# Patient Record
Sex: Female | Born: 1990 | Hispanic: Yes | Marital: Married | Smoking: Never smoker
Health system: Southern US, Community
[De-identification: ages and names within clinical notes are randomized; demographics above are authoritative.]

## PROBLEM LIST (undated history)

## (undated) ENCOUNTER — Inpatient Hospital Stay (HOSPITAL_COMMUNITY): Payer: Self-pay

## (undated) DIAGNOSIS — K429 Umbilical hernia without obstruction or gangrene: Secondary | ICD-10-CM

## (undated) DIAGNOSIS — D219 Benign neoplasm of connective and other soft tissue, unspecified: Secondary | ICD-10-CM

## (undated) DIAGNOSIS — O26613 Liver and biliary tract disorders in pregnancy, third trimester: Secondary | ICD-10-CM

## (undated) DIAGNOSIS — K831 Obstruction of bile duct: Secondary | ICD-10-CM

## (undated) DIAGNOSIS — Q513 Bicornate uterus: Secondary | ICD-10-CM

## (undated) DIAGNOSIS — O26643 Intrahepatic cholestasis of pregnancy, third trimester: Secondary | ICD-10-CM

## (undated) HISTORY — DX: Intrahepatic cholestasis of pregnancy, third trimester: O26.643

## (undated) HISTORY — DX: Obstruction of bile duct: K83.1

## (undated) HISTORY — DX: Liver and biliary tract disorders in pregnancy, third trimester: O26.613

---

## 2016-09-24 ENCOUNTER — Ambulatory Visit (INDEPENDENT_AMBULATORY_CARE_PROVIDER_SITE_OTHER): Payer: Self-pay

## 2016-09-24 ENCOUNTER — Ambulatory Visit (INDEPENDENT_AMBULATORY_CARE_PROVIDER_SITE_OTHER): Payer: Self-pay | Admitting: Physician Assistant

## 2016-09-24 VITALS — BP 128/82 | HR 80 | Temp 98.5°F | Resp 17 | Ht 63.5 in | Wt 124.0 lb

## 2016-09-24 DIAGNOSIS — T148XXA Other injury of unspecified body region, initial encounter: Secondary | ICD-10-CM

## 2016-09-24 DIAGNOSIS — M25552 Pain in left hip: Secondary | ICD-10-CM

## 2016-09-24 MED ORDER — MELOXICAM 7.5 MG PO TABS: 7.5000 mg | ORAL_TABLET | Freq: Every day | ORAL | 1 refills | Status: DC

## 2016-09-24 NOTE — Patient Instructions (Addendum)
Stretch your muscle, use ice and/or heat as necessary - 15 minutes at a time.  Mobic is an anti-inflammatory, you may take up to 15 mg a day -- Two in the morning or two at night, one in the morning and one at night -- do whatever feels best to you.   Thank you for coming in today. I hope you feel we met your needs.  Feel free to call [facility] if you have any questions or further requests.  Please consider signing up for MyChart if you do not already have it, as this is a great way to communicate with me.  Best,  Whitney McVey, PA-C   IF you received an x-ray today, you will receive an invoice from [facility]. Please contact [facility] at [phone] with questions or concerns regarding your invoice.   IF you received labwork today, you will receive an invoice from [employer]. Please contact [facility] at [phone] with questions or concerns regarding your invoice.   Our billing staff will not be able to assist you with questions regarding bills from these companies.  You will be contacted with the lab results as soon as they are available. The fastest way to get your results is to activate your My Chart account. Instructions are located on the last page of this paperwork. If you have not heard from [place] regarding the results in 2 weeks, please contact this office.
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
## 2016-11-08 ENCOUNTER — Emergency Department (HOSPITAL_COMMUNITY)
Admission: EM | Admit: 2016-11-08 | Discharge: 2016-11-09 | Disposition: A | Discharge status: Home / Self Care | Payer: Self-pay | Attending: Physician Assistant | Admitting: Physician Assistant

## 2016-11-08 ENCOUNTER — Emergency Department (HOSPITAL_COMMUNITY): Payer: Self-pay

## 2016-11-08 ENCOUNTER — Encounter (HOSPITAL_COMMUNITY): Payer: Self-pay

## 2016-11-08 DIAGNOSIS — Z79899 Other long term (current) drug therapy: Secondary | ICD-10-CM | POA: Insufficient documentation

## 2016-11-08 DIAGNOSIS — O0281 Inappropriate change in quantitative human chorionic gonadotropin (hCG) in early pregnancy: Secondary | ICD-10-CM | POA: Insufficient documentation

## 2016-11-08 DIAGNOSIS — N3000 Acute cystitis without hematuria: Secondary | ICD-10-CM

## 2016-11-08 DIAGNOSIS — N939 Abnormal uterine and vaginal bleeding, unspecified: Secondary | ICD-10-CM

## 2016-11-08 DIAGNOSIS — O039 Complete or unspecified spontaneous abortion without complication: Secondary | ICD-10-CM

## 2016-11-08 LAB — COMPREHENSIVE METABOLIC PANEL
ALBUMIN: 4.3 g/dL (ref 3.5–5.0)
ALT: 14 U/L (ref 14–54)
ANION GAP: 9 (ref 5–15)
AST: 18 U/L (ref 15–41)
Alkaline Phosphatase: 36 U/L — ABNORMAL LOW (ref 38–126)
BUN: 7 mg/dL (ref 6–20)
CHLORIDE: 105 mmol/L (ref 101–111)
CO2: 24 mmol/L (ref 22–32)
Calcium: 9.2 mg/dL (ref 8.9–10.3)
Creatinine, Ser: 0.78 mg/dL (ref 0.44–1.00)
GFR calc Af Amer: >60 mL/min (ref >60)
GFR calc non Af Amer: >60 mL/min (ref >60)
GLUCOSE: 97 mg/dL (ref 65–99)
POTASSIUM: 3.5 mmol/L (ref 3.5–5.1)
Sodium: 138 mmol/L (ref 135–145)
Total Bilirubin: 0.7 mg/dL (ref 0.3–1.2)
Total Protein: 7.2 g/dL (ref 6.5–8.1)

## 2016-11-08 LAB — CBC
HEMATOCRIT: 38.9 % (ref 36.0–46.0)
HEMOGLOBIN: 13 g/dL (ref 12.0–15.0)
MCH: 27.5 pg (ref 26.0–34.0)
MCHC: 33.4 g/dL (ref 30.0–36.0)
MCV: 82.2 fL (ref 78.0–100.0)
Platelets: 182 10*3/uL (ref 150–400)
RBC: 4.73 MIL/uL (ref 3.87–5.11)
RDW: 13.5 % (ref 11.5–15.5)
WBC: 9.3 10*3/uL (ref 4.0–10.5)

## 2016-11-08 LAB — LIPASE, BLOOD: LIPASE: 29 U/L (ref 11–51)

## 2016-11-08 LAB — URINALYSIS, ROUTINE W REFLEX MICROSCOPIC
Bilirubin Urine: NEGATIVE
GLUCOSE, UA: 250 mg/dL — AB
Hgb urine dipstick: NEGATIVE
NITRITE: POSITIVE — AB
PH: 6.5 (ref 5.0–8.0)
Protein, ur: >300 mg/dL — AB
SPECIFIC GRAVITY, URINE: 1.01 (ref 1.005–1.030)

## 2016-11-08 LAB — URINALYSIS, MICROSCOPIC (REFLEX)

## 2016-11-08 LAB — HCG, QUANTITATIVE, PREGNANCY: hCG, Beta Chain, Quant, S: 27838 m[IU]/mL — ABNORMAL HIGH (ref <5)

## 2016-11-08 MED ORDER — DEXTROSE 5 % IV SOLN
1.0000 g | Freq: Once | INTRAVENOUS | Status: AC
  Administered 2016-11-08: 1 g via INTRAVENOUS
  Filled 2016-11-08: qty 10

## 2016-11-08 MED ORDER — SODIUM CHLORIDE 0.9 % IV BOLUS (SEPSIS)
1000.0000 mL | Freq: Once | INTRAVENOUS | Status: AC
Start: 2016-11-08 — End: 2016-11-09
  Administered 2016-11-08: 1000 mL via INTRAVENOUS

## 2016-11-08 NOTE — ED Triage Notes (Signed)
Pt complaining of vaginal bleeding. Pt states LMP last month. Believes is pregnant. Unsure if miscarriage. Pt complaining of lower abdominal pain/cramping.

## 2016-11-08 NOTE — ED Provider Notes (Signed)
[facility] DEPT Provider Note   CSN: [id number] Arrival date & time: 11/08/16  2024     History   Chief Complaint Chief Complaint  Patient presents with  . Abdominal Pain  . Vaginal Bleeding    HPI Robyn Harvey is a 25 y.o. female.  Robyn Harvey is a 25 y.o. Female who presents to the emergency department complaining of vaginal bleeding since yesterday. She reports this started off light and became progressively worse today. She reports soiling 4 pads today. She reports bilateral lower abdominal cramping today. She does report some dysuria this morning. She denies other urinary symptoms. She denies previous pregnancy. She is sexually active. Last menstrual cycle was 09/05/2016. Patient denies fevers, chest pain, shortness of breath, lightheadedness, dizziness, syncope, urinary frequency, urinary urgency, back pain or rashes.   The history is provided by the spouse and the patient. The history is limited by a language barrier. A language interpreter was used.  Abdominal Pain   Associated symptoms include dysuria. Pertinent negatives include fever, diarrhea, nausea, vomiting, frequency, hematuria and headaches.  Vaginal Bleeding  Primary symptoms include pelvic pain, dysuria, and vaginal bleeding. Associated symptoms include abdominal pain. Pertinent negatives include no diarrhea, no nausea, no vomiting, no frequency, no light-headedness and no dizziness.    No past medical history on file.  There are no active problems to display for this patient.   No past surgical history on file.  OB History    No data available       Home Medications    Prior to Admission medications   Medication Sig Start Date End Date Taking? Authorizing Provider  acetaminophen (TYLENOL) 500 MG tablet Take 1 tablet (500 mg total) by mouth every 6 (six) hours as needed. 11/09/16   Waynetta Pean, PA-C  cephALEXin (KEFLEX) 500 MG capsule Take 1 capsule (500 mg total) by mouth 3 (three)  times daily. 11/09/16   Waynetta Pean, PA-C    Family History No family history on file.  Social History Social History  Substance Use Topics  . Smoking status: Never Smoker  . Smokeless tobacco: Never Used  . Alcohol use No     Allergies   Patient has no allergy information on record.   Review of Systems Review of Systems  Constitutional: Negative for chills and fever.  HENT: Negative for congestion and sore throat.   Eyes: Negative for visual disturbance.  Respiratory: Negative for cough and shortness of breath.   Cardiovascular: Negative for chest pain.  Gastrointestinal: Positive for abdominal pain. Negative for diarrhea, nausea and vomiting.  Genitourinary: Positive for dysuria, pelvic pain and vaginal bleeding. Negative for difficulty urinating, flank pain, frequency and hematuria.  Musculoskeletal: Negative for back pain and neck pain.  Skin: Negative for rash.  Neurological: Negative for dizziness, syncope, light-headedness and headaches.     Physical Exam Updated Vital Signs BP 92/58   Pulse 95   Temp 98 F (36.7 C) (Oral)   Resp 18   LMP 10/09/2016   SpO2 98%   Physical Exam  Constitutional: She appears well-developed and well-nourished. No distress.  Nontoxic appearing.  HENT:  Head: Normocephalic and atraumatic.  Mouth/Throat: Oropharynx is clear and moist.  Eyes: Conjunctivae are normal. Pupils are equal, round, and reactive to light. Right eye exhibits no discharge. Left eye exhibits no discharge.  Neck: Neck supple.  Cardiovascular: Normal rate, regular rhythm, normal heart sounds and intact distal pulses.  Exam reveals no gallop and no friction rub.   No murmur heard. Pulmonary/Chest:  Effort normal and breath sounds normal. No respiratory distress. She has no wheezes. She has no rales.  Abdominal: Soft. Bowel sounds are normal. She exhibits no distension and no mass. There is tenderness. There is no guarding.  Abdomen is soft. Mild bilateral  lower abdominal tenderness to palpation. No peritoneal signs. No CVA or flank tenderness.  Genitourinary:  Genitourinary Comments: Pelvic exam performed by me with female RN chaperone. Small amount of vaginal bleeding noted. Cervix is closed. No cervical motion tenderness. No adnexal tenderness or fullness.  Musculoskeletal: She exhibits no edema.  Lymphadenopathy:    She has no cervical adenopathy.  Neurological: She is alert. Coordination normal.  Skin: Skin is warm and dry. Capillary refill takes less than 2 seconds. No rash noted. She is not diaphoretic. No erythema. No pallor.  Psychiatric: She has a normal mood and affect. Her behavior is normal.  Nursing note and vitals reviewed.    ED Treatments / Results  Labs (all labs ordered are listed, but only abnormal results are displayed) Labs Reviewed  WET PREP, GENITAL - Abnormal; Notable for the following:       Result Value   WBC, Wet Prep HPF POC FEW (*)    All other components within normal limits  COMPREHENSIVE METABOLIC PANEL - Abnormal; Notable for the following:    Alkaline Phosphatase 36 (*)    All other components within normal limits  URINALYSIS, ROUTINE W REFLEX MICROSCOPIC - Abnormal; Notable for the following:    Color, Urine RED (*)    APPearance TURBID (*)    Glucose, UA 250 (*)    Ketones, ur >=80 (*)    Protein, ur >300 (*)    Nitrite POSITIVE (*)    Leukocytes, UA MODERATE (*)    All other components within normal limits  HCG, QUANTITATIVE, PREGNANCY - Abnormal; Notable for the following:    hCG, Beta Chain, [place] Brown, S 27,838 (*)    All other components within normal limits  URINALYSIS, MICROSCOPIC (REFLEX) - Abnormal; Notable for the following:    Bacteria, UA RARE (*)    Squamous Epithelial / LPF 0-5 (*)    All other components within normal limits  URINE CULTURE  LIPASE, BLOOD  CBC  GC/CHLAMYDIA PROBE AMP ([place]) NOT AT [facility]    EKG  EKG Interpretation None       Radiology US Ob Comp <  14 Wks  Result Date: 11/09/2016 CLINICAL DATA:  Acute onset of vaginal bleeding.  Initial encounter. EXAM: OBSTETRIC <14 WK [place] AND TRANSVAGINAL OB US TECHNIQUE: Both transabdominal and transvaginal ultrasound examinations were performed for complete evaluation of the gestation as well as the maternal uterus, adnexal regions, and pelvic cul-de-sac. Transvaginal technique was performed to assess early pregnancy. COMPARISON:  None. FINDINGS: Intrauterine gestational sac: None seen. Yolk sac:  N/A Embryo:  N/A Subchorionic hemorrhage:  None visualized. Maternal uterus/adnexae: An apparent partial septate uterus is noted. The ovaries are not visualized. No suspicious adnexal masses are seen; there is no secondary evidence for ovarian torsion. No free fluid is seen within the pelvic cul-de-sac. IMPRESSION: 1. No intrauterine gestational sac seen. No evidence for ectopic pregnancy at this time. Given the quantitative beta HCG level of 27,838, this may reflect recent spontaneous abortion. 2. Apparent partial septate uterus noted. Electronically Signed   By: Garald Balding M.D.   On: 11/09/2016 00:21   US Ob Transvaginal  Result Date: 11/09/2016 CLINICAL DATA:  Acute onset of vaginal bleeding.  Initial encounter. EXAM: OBSTETRIC <14  WK [place] AND TRANSVAGINAL OB US TECHNIQUE: Both transabdominal and transvaginal ultrasound examinations were performed for complete evaluation of the gestation as well as the maternal uterus, adnexal regions, and pelvic cul-de-sac. Transvaginal technique was performed to assess early pregnancy. COMPARISON:  None. FINDINGS: Intrauterine gestational sac: None seen. Yolk sac:  N/A Embryo:  N/A Subchorionic hemorrhage:  None visualized. Maternal uterus/adnexae: An apparent partial septate uterus is noted. The ovaries are not visualized. No suspicious adnexal masses are seen; there is no secondary evidence for ovarian torsion. No free fluid is seen within the pelvic cul-de-sac. IMPRESSION: 1. No  intrauterine gestational sac seen. No evidence for ectopic pregnancy at this time. Given the quantitative beta HCG level of 27,838, this may reflect recent spontaneous abortion. 2. Apparent partial septate uterus noted. Electronically Signed   By: Garald Balding M.D.   On: 11/09/2016 00:21    Procedures Procedures (including critical care time)  Medications Ordered in ED Medications  sodium chloride 0.9 % bolus 1,000 mL (0 mLs Intravenous Stopped 11/09/16 0049)  cefTRIAXone (ROCEPHIN) 1 g in dextrose 5 % 50 mL IVPB (0 g Intravenous Stopped 11/08/16 2323)     Initial Impression / Assessment and Plan / ED Course  I have reviewed the triage vital signs and the nursing notes.  Pertinent labs & imaging results that were available during my care of the patient were reviewed by me and considered in my medical decision making (see chart for details).  Clinical Course    This  is a 25 y.o. Female who presents to the emergency department complaining of vaginal bleeding since yesterday. She reports this started off light and became progressively worse today. She reports soiling 4 pads today. She reports bilateral lower abdominal cramping today. She does report some dysuria this morning. She denies other urinary symptoms. She denies previous pregnancy. She is sexually active. Last menstrual cycle was 09/05/2016. Patient denies fevers, chest pain, shortness of breath, lightheadedness, dizziness.  On examination patient is afebrile nontoxic appearing. Abdomen is soft and she has bilateral lower abdominal tenderness to palpation without peritoneal signs. On pelvic exam she has a slight amount of vaginal bleeding. Cervix is closed. No cervical motion tenderness. No adnexal tenderness or fullness. Urinalysis is nitrite positive with moderate leukocytes. Will provide with a gram of Rocephin for UTI. Quantitative pregnancy is [phone]. OB ultrasound was obtained which showed no intrauterine gestational sac. No  evidence for ectopic pregnancy. Likely spontaneous abortion. I advised the patient of these findings. We'll have her follow-up at the [facility] outpatient clinic in 48 hours. Keflex at home for UTI. I discussed return precautions. I advised the patient to follow-up with their primary care provider this week. I advised the patient to return to the emergency department with new or worsening symptoms or new concerns. The patient verbalized understanding and agreement with plan.      Final Clinical Impressions(s) / ED Diagnoses   Final diagnoses:  Vaginal bleeding  Spontaneous abortion  Acute cystitis without hematuria    New Prescriptions New Prescriptions   ACETAMINOPHEN (TYLENOL) 500 MG TABLET    Take 1 tablet (500 mg total) by mouth every 6 (six) hours as needed.   CEPHALEXIN (KEFLEX) 500 MG CAPSULE    Take 1 capsule (500 mg total) by mouth 3 (three) times daily.     Waynetta Pean, PA-C 11/09/16 0149    Courteney Julio Alm, MD 11/12/16 [id number]

## 2016-11-08 NOTE — ED Notes (Signed)
Patient transported to Ultrasound 

## 2016-11-09 LAB — GC/CHLAMYDIA PROBE AMP (~~LOC~~) NOT AT ARMC
CHLAMYDIA, DNA PROBE: NEGATIVE
Neisseria Gonorrhea: NEGATIVE

## 2016-11-09 LAB — WET PREP, GENITAL
Clue Cells Wet Prep HPF POC: NONE SEEN
Sperm: NONE SEEN
Trich, Wet Prep: NONE SEEN
YEAST WET PREP: NONE SEEN

## 2016-11-09 MED ORDER — CEPHALEXIN 500 MG PO CAPS: 500.0000 mg | ORAL_CAPSULE | Freq: Three times a day (TID) | ORAL | 0 refills | Status: DC

## 2016-11-09 MED ORDER — ACETAMINOPHEN 500 MG PO TABS: 500.0000 mg | ORAL_TABLET | Freq: Four times a day (QID) | ORAL | 0 refills | Status: DC | PRN

## 2016-11-09 NOTE — ED Notes (Signed)
Pt departed in NAD.  

## 2016-11-10 LAB — URINE CULTURE

## 2016-11-11 ENCOUNTER — Other Ambulatory Visit: Payer: Self-pay

## 2016-11-11 DIAGNOSIS — O039 Complete or unspecified spontaneous abortion without complication: Secondary | ICD-10-CM

## 2016-11-12 LAB — HCG, QUANTITATIVE, PREGNANCY: HCG, BETA CHAIN, QUANT, S: 2178.2 m[IU]/mL — AB

## 2017-01-10 ENCOUNTER — Ambulatory Visit (INDEPENDENT_AMBULATORY_CARE_PROVIDER_SITE_OTHER): Payer: Self-pay | Admitting: Family Medicine

## 2017-01-10 ENCOUNTER — Ambulatory Visit (INDEPENDENT_AMBULATORY_CARE_PROVIDER_SITE_OTHER): Payer: Self-pay

## 2017-01-10 ENCOUNTER — Encounter: Payer: Self-pay | Admitting: Family Medicine

## 2017-01-10 VITALS — BP 108/72 | HR 89 | Temp 98.3°F | Ht 63.5 in | Wt 127.0 lb

## 2017-01-10 DIAGNOSIS — Z23 Encounter for immunization: Secondary | ICD-10-CM

## 2017-01-10 DIAGNOSIS — R1084 Generalized abdominal pain: Secondary | ICD-10-CM

## 2017-01-10 DIAGNOSIS — K5900 Constipation, unspecified: Secondary | ICD-10-CM

## 2017-01-10 LAB — POCT CBC
Granulocyte percent: 60.3 %G (ref 37–80)
HCT, POC: 36.8 % — AB (ref 37.7–47.9)
Hemoglobin: 12.7 g/dL (ref 12.2–16.2)
Lymph, poc: 1.8 (ref 0.6–3.4)
MCH: 28 pg (ref 27–31.2)
MCHC: 34.4 g/dL (ref 31.8–35.4)
MCV: 81.4 fL (ref 80–97)
MID (CBC): 0.3 (ref 0–0.9)
MPV: 7.4 fL (ref 0–99.8)
POC GRANULOCYTE: 3.2 (ref 2–6.9)
POC LYMPH PERCENT: 34.5 %L (ref 10–50)
POC MID %: 5.2 % (ref 0–12)
Platelet Count, POC: 190 10*3/uL (ref 142–424)
RBC: 4.52 M/uL (ref 4.04–5.48)
RDW, POC: 14.3 %
WBC: 5.3 10*3/uL (ref 4.6–10.2)

## 2017-01-10 LAB — POCT URINALYSIS DIP (MANUAL ENTRY)
BILIRUBIN UA: NEGATIVE
Glucose, UA: NEGATIVE
Ketones, POC UA: NEGATIVE
LEUKOCYTES UA: NEGATIVE
Nitrite, UA: NEGATIVE
PH UA: 5.5
Spec Grav, UA: >=1.03
Urobilinogen, UA: 0.2

## 2017-01-10 LAB — POC MICROSCOPIC URINALYSIS (UMFC): Mucus: ABSENT

## 2017-01-10 LAB — POCT URINE PREGNANCY: PREG TEST UR: NEGATIVE

## 2017-01-10 MED ORDER — RANITIDINE HCL 150 MG PO TABS: 150.0000 mg | ORAL_TABLET | Freq: Every day | ORAL | 0 refills | Status: DC

## 2017-01-10 NOTE — Progress Notes (Signed)
Patient ID: Robyn Harvey, female    DOB: February 18, 1991, 27 y.o.   MRN: [id number]  PCP: No PCP Per Patient  Chief Complaint  Patient presents with  . Abdominal Pain    X 2 weeks    Subjective:  HPI [id number]   26 year old female presents for evaluation of abdominal pain  X 2 weeks. Complains of RUQ abdominal pain that starts on the left side and radiates to right side.  Started on left side and right and center of stomach. Not so painful today. Occurs intermittently over the last 2 weeks. At its worst sharp and interferes with sleep. No medication. Feels nausea. No nausea or diarrhea  Headache, chills, and hot flashes. Abdominal pain radiates to lower back occasionally. Complains some days she has increased urine frequency and urine is more darker than normal. Unknown if fever.  Social History   Social History  . Marital status: Married    Spouse name: N/A  . Number of children: N/A  . Years of education: N/A   Occupational History  . Not on file.   Social History Main Topics  . Smoking status: Never Smoker  . Smokeless tobacco: Never Used  . Alcohol use No  . Drug use: No  . Sexual activity: No   Other Topics Concern  . Not on file   Social History Narrative   ** Merged History Encounter **       Review of Systems  See HPI  Prior to Admission medications   Medication Sig Start Date End Date Taking? Authorizing Provider  acetaminophen (TYLENOL) 500 MG tablet Take 1 tablet (500 mg total) by mouth every 6 (six) hours as needed. 11/09/16  Yes Waynetta Pean, PA-C  cephALEXin (KEFLEX) 500 MG capsule Take 1 capsule (500 mg total) by mouth 3 (three) times daily. Patient not taking: Reported on 01/10/2017 11/09/16   Waynetta Pean, PA-C  meloxicam (MOBIC) 7.5 MG tablet Take 1 tablet (7.5 mg total) by mouth daily. May take up to 15mg  day Patient not taking: Reported on 01/10/2017 09/24/16   Gelene Mink McVey, PA-C    Past Medical, Surgical Family  and Social History reviewed and updated.    Objective:   Today's Vitals   01/10/17 1426  BP: 108/72  Pulse: 89  Temp: 98.3 F (36.8 C)  TempSrc: Oral  SpO2: 99%  Weight: 127 lb (57.6 kg)  Height: 5' 3.5" (1.613 m)    Wt Readings from Last 3 Encounters:  01/10/17 127 lb (57.6 kg)  09/24/16 124 lb (56.2 kg)   Physical Exam  Constitutional: She is oriented to person, place, and time. She appears well-developed and well-nourished.  HENT:  Head: Normocephalic and atraumatic.  Eyes: Pupils are equal, round, and reactive to light.  Neck: Normal range of motion. Neck supple.  Cardiovascular: Normal rate, regular rhythm, normal heart sounds and intact distal pulses.   Pulmonary/Chest: Effort normal and breath sounds normal.  Abdominal: Soft. Bowel sounds are normal. There is generalized tenderness. There is no CVA tenderness.  Musculoskeletal: Normal range of motion.  Lymphadenopathy:    She has no cervical adenopathy.  Neurological: She is alert and oriented to person, place, and time.  Skin: Skin is warm and dry.  Psychiatric: She has a normal mood and affect. Her behavior is normal. Judgment and thought content normal.   Assessment & Plan:  1. Generalized abdominal pain 2. Need for prophylactic vaccination and inoculation against influenza 3. Constipation, unspecified constipation type  Robyn Harvey 25  year old female presents today, absent of acute distress, overall well appearing, and denies currently experiencing any abdominal pain. Reports intermittent abdominal pain that starts sometimes in RUQ and radiates to LLQ and bilateral back. On exam today she localized the pain lateral left of the umbilicus. Negative of mass or rebound tenderness with palpation. KUB revealed significant stool burden. UA showed trace of protein with moderate hematuria. I attribute hematuria to patient is due to have her menses. Urine pregnancy test was negative.  Plan: Treating for constipation    -Purchase over the counter Miralax to relieve bowel burden 17 grams per day in 8 oz of water drink until you experience at least 5 bulky stools.  For acute abdominal discomfort and bloating take Ranitidine 150 mg once daily as needed.     Return for follow-up as needed or if conditions worsens.  Carroll Sage. Kenton Kingfisher, MSN, FNP-C [facility]

## 2017-01-10 NOTE — Patient Instructions (Addendum)
Constipation  Purchase over the counter Miralax to relieve bowel burden 17 grams per day in 8 oz of water.  Ranitidine 150 mg once daily for abdominal discomfort.     IF you received an x-ray today, you will receive an invoice from [facility]. Please contact [facility] at [phone] with questions or concerns regarding your invoice.   IF you received labwork today, you will receive an invoice from [facility]. Please contact [facility] at [phone] with questions or concerns regarding your invoice.   Our billing staff will not be able to assist you with questions regarding bills from these companies.  You will be contacted with the lab results as soon as they are available. The fastest way to get your results is to activate your My Chart account. Instructions are located on the last page of this paperwork. If you have not heard from [place] regarding the results in 2 weeks, please contact this office.     High-Fiber Diet Fiber, also called dietary fiber, is a type of carbohydrate found in fruits, vegetables, whole grains, and beans. A high-fiber diet can have many health benefits. Your health care provider may recommend a high-fiber diet to help:  Prevent constipation. Fiber can make your bowel movements more regular.  Lower your cholesterol.  Relieve hemorrhoids, uncomplicated diverticulosis, or irritable bowel syndrome.  Prevent overeating as part of a weight-loss plan.  Prevent heart disease, type 2 diabetes, and certain cancers. What is my plan? The recommended daily intake of fiber includes:  38 grams for men under age 65.  70 grams for men over age 86.  73 grams for women under age 52.  66 grams for women over age 78. You can get the recommended daily intake of dietary fiber by eating a variety of fruits, vegetables, grains, and beans. Your health care provider may also recommend a fiber supplement if it is not possible to get enough fiber through your  diet. What do I need to know about a high-fiber diet?  Fiber supplements have not been widely studied for their effectiveness, so it is better to get fiber through food sources.  Always check the fiber content on thenutrition facts label of any prepackaged food. Look for foods that contain at least 5 grams of fiber per serving.  Ask your dietitian if you have questions about specific foods that are related to your condition, especially if those foods are not listed in the following section.  Increase your daily fiber consumption gradually. Increasing your intake of dietary fiber too quickly may cause bloating, cramping, or gas.  Drink plenty of water. Water helps you to digest fiber. What foods can I eat? Grains  Whole-grain breads. Multigrain cereal. Oats and oatmeal. Brown rice. Barley. Bulgur wheat. [place]. Bran muffins. Popcorn. Rye wafer crackers. Vegetables  Sweet potatoes. Spinach. Kale. Artichokes. Cabbage. Broccoli. Green peas. Carrots. Squash. Fruits  Berries. Pears. Apples. Oranges. Avocados. Prunes and raisins. Dried figs. Meats and Other Protein Sources  [place], kidney, pinto, and soy beans. Split peas. Lentils. Nuts and seeds. Dairy  Fiber-fortified yogurt. Beverages  Fiber-fortified soy milk. Fiber-fortified orange juice. Other  Fiber bars. The items listed above may not be a complete list of recommended foods or beverages. Contact your dietitian for more options.  What foods are not recommended? Grains  White bread. Pasta made with refined flour. White rice. Vegetables  Fried potatoes. Canned vegetables. Well-cooked vegetables. Fruits  Fruit juice. Cooked, strained fruit. Meats and Other Protein Sources  Fatty cuts of meat. [place]  poultry or fried fish. Dairy  Milk. Yogurt. Cream cheese. Sour cream. Beverages  Soft drinks. Other  Cakes and pastries. Butter and oils. The items listed above may not be a complete list of foods and beverages to avoid. Contact your  dietitian for more information.  What are some tips for including high-fiber foods in my diet?  Eat a wide variety of high-fiber foods.  Make sure that half of all grains consumed each day are whole grains.  Replace breads and cereals made from refined flour or white flour with whole-grain breads and cereals.  Replace white rice with brown rice, bulgur wheat, or millet.  Start the day with a breakfast that is high in fiber, such as a cereal that contains at least 5 grams of fiber per serving.  Use beans in place of meat in soups, salads, or pasta.  Eat high-fiber snacks, such as berries, raw vegetables, nuts, or popcorn. This information is not intended to replace advice given to you by your health care provider. Make sure you discuss any questions you have with your health care provider. Document Released: 11/01/2005 Document Revised: 04/08/2016 Document Reviewed: 04/16/2014 Elsevier Interactive Patient Education  2017 [employer].

## 2017-03-03 ENCOUNTER — Encounter (HOSPITAL_COMMUNITY): Payer: Self-pay

## 2017-03-03 DIAGNOSIS — R102 Pelvic and perineal pain: Secondary | ICD-10-CM | POA: Insufficient documentation

## 2017-03-03 DIAGNOSIS — Z79899 Other long term (current) drug therapy: Secondary | ICD-10-CM | POA: Insufficient documentation

## 2017-03-03 LAB — URINALYSIS, ROUTINE W REFLEX MICROSCOPIC
BILIRUBIN URINE: NEGATIVE
Bacteria, UA: NONE SEEN
Glucose, UA: NEGATIVE mg/dL
KETONES UR: NEGATIVE mg/dL
Leukocytes, UA: NEGATIVE
Nitrite: NEGATIVE
PH: 8 (ref 5.0–8.0)
Protein, ur: NEGATIVE mg/dL
SPECIFIC GRAVITY, URINE: 1.017 (ref 1.005–1.030)
SQUAMOUS EPITHELIAL / LPF: NONE SEEN

## 2017-03-03 LAB — POC URINE PREG, ED: PREG TEST UR: NEGATIVE

## 2017-03-03 NOTE — ED Triage Notes (Signed)
Pt endorses right pelvic pain that began yesterday. Pt also states that she had a drop of blood in her urine. VSS. Denies n/v/d.

## 2017-03-04 ENCOUNTER — Emergency Department (HOSPITAL_COMMUNITY): Payer: Self-pay

## 2017-03-04 ENCOUNTER — Emergency Department (HOSPITAL_COMMUNITY)
Admission: EM | Admit: 2017-03-04 | Discharge: 2017-03-04 | Disposition: A | Discharge status: Home / Self Care | Payer: Self-pay | Attending: Emergency Medicine | Admitting: Emergency Medicine

## 2017-03-04 DIAGNOSIS — R102 Pelvic and perineal pain: Secondary | ICD-10-CM

## 2017-03-04 LAB — GC/CHLAMYDIA PROBE AMP (~~LOC~~) NOT AT ARMC
CHLAMYDIA, DNA PROBE: NEGATIVE
Neisseria Gonorrhea: NEGATIVE

## 2017-03-04 LAB — WET PREP, GENITAL
SPERM: NONE SEEN
Trich, Wet Prep: NONE SEEN
YEAST WET PREP: NONE SEEN

## 2017-03-04 MED ORDER — ACETAMINOPHEN 500 MG PO TABS
1000.0000 mg | ORAL_TABLET | Freq: Once | ORAL | Status: AC
  Administered 2017-03-04: 1000 mg via ORAL
  Filled 2017-03-04: qty 2

## 2017-03-04 NOTE — Discharge Instructions (Signed)
Your pelvic ultrasound did not show any concerning cause of your pain. We believe that your symptoms may be due to a muscle strain.Take tylenol for pain. Follow up with your primary care doctor to ensure that symptoms resolve.  Su ultrasonido plvico no mostr ninguna causa relacionada con su dolor. Creemos que sus sntomas pueden deberse a una distensin muscular. Tome Tylenol para Conservation officer, historic buildings. Haga un seguimiento con su mdico de atencin primaria para asegurarse de que los sntomas se resuelvan.

## 2017-03-04 NOTE — ED Provider Notes (Signed)
[facility] DEPT Provider Note   CSN: [id number] Arrival date & time: 03/03/17  2208    History   Chief Complaint Chief Complaint  Patient presents with  . Pelvic Pain    HPI Robyn Harvey is a 26 y.o. female.  26 year old female with a history of abortion presents to the emergency department for right-sided pelvic pain. She states the pain began yesterday. Pain has been fairly constant, but waxing and waning in severity. She notes that it radiates down her right thigh. It is aggravated with ambulation. Patient has not taken any medications for her symptoms. She denies fever, vomiting, diarrhea, dysuria. She did notice one drop of blood in her urine yesterday. Otherwise, the patient has had no complaints of hematuria. No history of abdominal surgeries.      History reviewed. No pertinent past medical history.  There are no active problems to display for this patient.   History reviewed. No pertinent surgical history.  OB History    No data available       Home Medications    Prior to Admission medications   Medication Sig Start Date End Date Taking? Authorizing Provider  acetaminophen (TYLENOL) 500 MG tablet Take 1 tablet (500 mg total) by mouth every 6 (six) hours as needed. Patient not taking: Reported on 03/04/2017 11/09/16   Waynetta Pean, PA-C    Family History History reviewed. No pertinent family history.  Social History Social History  Substance Use Topics  . Smoking status: Never Smoker  . Smokeless tobacco: Never Used  . Alcohol use No     Allergies   Patient has no known allergies.   Review of Systems Review of Systems Ten systems reviewed and are negative for acute change, except as noted in the HPI.    Physical Exam Updated Vital Signs BP 91/61   Pulse 82   Temp 98.3 F (36.8 C) (Oral)   Resp 18   Ht 5\' 2"  (1.575 m)   Wt 59 kg   LMP 02/08/2017 (Exact Date)   SpO2 98%   BMI 23.78 kg/m   Physical Exam    Constitutional: She is oriented to person, place, and time. She appears well-developed and well-nourished. No distress.  Nontoxic and in no acute distress  HENT:  Head: Normocephalic and atraumatic.  Eyes: Conjunctivae and EOM are normal. No scleral icterus.  Neck: Normal range of motion.  Pulmonary/Chest: Effort normal. No respiratory distress. She has no wheezes.  Respirations even and unlabored.  Abdominal: Soft. She exhibits no distension and no mass. There is no tenderness. There is no guarding.    Soft, nontender abdomen. No distention or peritoneal signs.  Genitourinary: Vagina normal. There is no rash, tenderness or lesion on the right labia. There is no rash, tenderness or lesion on the left labia. Uterus is not tender. Cervix exhibits no motion tenderness and no friability. Right adnexum displays tenderness. Right adnexum displays no mass. Left adnexum displays no mass and no tenderness.  Musculoskeletal: Normal range of motion.  TTP to right groin without crepitus or deformity. Normal ROM of BLE.  Neurological: She is alert and oriented to person, place, and time.  Skin: Skin is warm and dry. No rash noted. She is not diaphoretic. No erythema. No pallor.  Psychiatric: She has a normal mood and affect. Her behavior is normal.  Nursing note and vitals reviewed.    ED Treatments / Results  Labs (all labs ordered are listed, but only abnormal results are displayed) Labs Reviewed  WET PREP, GENITAL - Abnormal; Notable for the following:       Result Value   Clue Cells Wet Prep HPF POC PRESENT (*)    WBC, Wet Prep HPF POC MANY (*)    All other components within normal limits  URINALYSIS, ROUTINE W REFLEX MICROSCOPIC - Abnormal; Notable for the following:    Hgb urine dipstick SMALL (*)    All other components within normal limits  POC URINE PREG, ED  GC/CHLAMYDIA PROBE AMP ([place]) NOT AT [facility]    EKG  EKG Interpretation None       Radiology US Transvaginal  Non-ob  Result Date: 03/04/2017 CLINICAL DATA:  Initial evaluation for acute right lower quadrant pelvic pain for 1 day. EXAM: TRANSABDOMINAL AND TRANSVAGINAL ULTRASOUND OF PELVIS DOPPLER ULTRASOUND OF OVARIES TECHNIQUE: Both transabdominal and transvaginal ultrasound examinations of the pelvis were performed. Transabdominal technique was performed for global imaging of the pelvis including uterus, ovaries, adnexal regions, and pelvic cul-de-sac. It was necessary to proceed with endovaginal exam following the transabdominal exam to visualize the uterus and ovaries. Color and duplex Doppler ultrasound was utilized to evaluate blood flow to the ovaries. COMPARISON:  None. FINDINGS: Uterus Measurements: 6.1 x 3.7 x 6.7 cm. Septate versus arcuate uterus noted. Small fibroid measuring 1.0 x 0.6 x 0.7 cm noted within the uterine body. Endometrium Thickness: 14-15 mm.  No focal abnormality visualized. Right ovary Measurements: 2.6 x 1.3 x 1.5 cm. Normal appearance/no adnexal mass. Left ovary Measurements: 3.7 x 2.0 x 2.4 cm. Normal appearance/no adnexal mass. Pulsed Doppler evaluation of both ovaries demonstrates normal low-resistance arterial and venous waveforms. Other findings No abnormal free fluid. IMPRESSION: 1. Negative pelvic ultrasound. No acute abnormality identified. No evidence for ovarian torsion. 2. Small uterine fibroid as above. Electronically Signed   By: Jeannine Boga M.D.   On: 03/04/2017 04:54   US Pelvis Complete  Result Date: 03/04/2017 CLINICAL DATA:  Initial evaluation for acute right lower quadrant pelvic pain for 1 day. EXAM: TRANSABDOMINAL AND TRANSVAGINAL ULTRASOUND OF PELVIS DOPPLER ULTRASOUND OF OVARIES TECHNIQUE: Both transabdominal and transvaginal ultrasound examinations of the pelvis were performed. Transabdominal technique was performed for global imaging of the pelvis including uterus, ovaries, adnexal regions, and pelvic cul-de-sac. It was necessary to proceed with  endovaginal exam following the transabdominal exam to visualize the uterus and ovaries. Color and duplex Doppler ultrasound was utilized to evaluate blood flow to the ovaries. COMPARISON:  None. FINDINGS: Uterus Measurements: 6.1 x 3.7 x 6.7 cm. Septate versus arcuate uterus noted. Small fibroid measuring 1.0 x 0.6 x 0.7 cm noted within the uterine body. Endometrium Thickness: 14-15 mm.  No focal abnormality visualized. Right ovary Measurements: 2.6 x 1.3 x 1.5 cm. Normal appearance/no adnexal mass. Left ovary Measurements: 3.7 x 2.0 x 2.4 cm. Normal appearance/no adnexal mass. Pulsed Doppler evaluation of both ovaries demonstrates normal low-resistance arterial and venous waveforms. Other findings No abnormal free fluid. IMPRESSION: 1. Negative pelvic ultrasound. No acute abnormality identified. No evidence for ovarian torsion. 2. Small uterine fibroid as above. Electronically Signed   By: Jeannine Boga M.D.   On: 03/04/2017 04:54   [place] Art/ven Flow Abd Pelv Doppler  Result Date: 03/04/2017 CLINICAL DATA:  Initial evaluation for acute right lower quadrant pelvic pain for 1 day. EXAM: TRANSABDOMINAL AND TRANSVAGINAL ULTRASOUND OF PELVIS DOPPLER ULTRASOUND OF OVARIES TECHNIQUE: Both transabdominal and transvaginal ultrasound examinations of the pelvis were performed. Transabdominal technique was performed for global imaging of the pelvis including uterus, ovaries, adnexal  regions, and pelvic cul-de-sac. It was necessary to proceed with endovaginal exam following the transabdominal exam to visualize the uterus and ovaries. Color and duplex Doppler ultrasound was utilized to evaluate blood flow to the ovaries. COMPARISON:  None. FINDINGS: Uterus Measurements: 6.1 x 3.7 x 6.7 cm. Septate versus arcuate uterus noted. Small fibroid measuring 1.0 x 0.6 x 0.7 cm noted within the uterine body. Endometrium Thickness: 14-15 mm.  No focal abnormality visualized. Right ovary Measurements: 2.6 x 1.3 x 1.5 cm. Normal  appearance/no adnexal mass. Left ovary Measurements: 3.7 x 2.0 x 2.4 cm. Normal appearance/no adnexal mass. Pulsed Doppler evaluation of both ovaries demonstrates normal low-resistance arterial and venous waveforms. Other findings No abnormal free fluid. IMPRESSION: 1. Negative pelvic ultrasound. No acute abnormality identified. No evidence for ovarian torsion. 2. Small uterine fibroid as above. Electronically Signed   By: Jeannine Boga M.D.   On: 03/04/2017 04:54    Procedures Procedures (including critical care time)  Medications Ordered in ED Medications  acetaminophen (TYLENOL) tablet 1,000 mg (1,000 mg Oral Given 03/04/17 0329)     Initial Impression / Assessment and Plan / ED Course  I have reviewed the triage vital signs and the nursing notes.  Pertinent labs & imaging results that were available during my care of the patient were reviewed by me and considered in my medical decision making (see chart for details).     26 year old female presents to the emergency department for evaluation of pelvic pain 2 days. Pain was found to be more focal to the right inguinal region. No palpable hernias. Patient also noted to have mild right adnexal tenderness on exam.  Workup today consisted of urinalysis and urine pregnancy which were both reassuring. Wet prep findings nonspecific. Gonorrhea/chlamydia culture pending. A pelvic ultrasound was obtained which does not show any evidence of TOA, ovarian torsion, or ovarian cyst. The patient has continued to rest comfortably in the emergency department. She is afebrile with stable vital signs. Repeat abdominal exam is stable; no signs of acute surgical abdomen.  Given reassuring workup, I believe the patient is appropriate for continued management on an outpatient basis. I do not see indication for further emergent imaging. Supportive management discussed and provided. Patient discharged in stable condition with no unaddressed  concerns.   Final Clinical Impressions(s) / ED Diagnoses   Final diagnoses:  Pelvic pain in female    New Prescriptions Discharge Medication List as of 03/04/2017  5:10 AM       Antonietta Breach, PA-C 03/04/17 [place], MD 03/06/17 [phone]

## 2017-03-04 NOTE — ED Notes (Signed)
Spanish speaker only.

## 2017-03-10 ENCOUNTER — Other Ambulatory Visit: Payer: Self-pay | Admitting: Family Medicine

## 2017-09-23 ENCOUNTER — Inpatient Hospital Stay (HOSPITAL_COMMUNITY): Payer: Self-pay

## 2017-09-23 ENCOUNTER — Encounter (HOSPITAL_COMMUNITY): Payer: Self-pay | Admitting: *Deleted

## 2017-09-23 ENCOUNTER — Inpatient Hospital Stay (HOSPITAL_COMMUNITY)
Admission: AD | Admit: 2017-09-23 | Discharge: 2017-09-23 | Disposition: A | Discharge status: Home / Self Care | Payer: Self-pay | Source: Ambulatory Visit | Attending: Obstetrics & Gynecology | Admitting: Obstetrics & Gynecology

## 2017-09-23 DIAGNOSIS — O3411 Maternal care for benign tumor of corpus uteri, first trimester: Secondary | ICD-10-CM | POA: Insufficient documentation

## 2017-09-23 DIAGNOSIS — O3410 Maternal care for benign tumor of corpus uteri, unspecified trimester: Secondary | ICD-10-CM

## 2017-09-23 DIAGNOSIS — O219 Vomiting of pregnancy, unspecified: Secondary | ICD-10-CM

## 2017-09-23 DIAGNOSIS — Z3A01 Less than 8 weeks gestation of pregnancy: Secondary | ICD-10-CM | POA: Insufficient documentation

## 2017-09-23 DIAGNOSIS — Z3491 Encounter for supervision of normal pregnancy, unspecified, first trimester: Secondary | ICD-10-CM

## 2017-09-23 DIAGNOSIS — D259 Leiomyoma of uterus, unspecified: Secondary | ICD-10-CM | POA: Insufficient documentation

## 2017-09-23 DIAGNOSIS — O209 Hemorrhage in early pregnancy, unspecified: Secondary | ICD-10-CM | POA: Insufficient documentation

## 2017-09-23 LAB — URINALYSIS, ROUTINE W REFLEX MICROSCOPIC
BILIRUBIN URINE: NEGATIVE
Glucose, UA: NEGATIVE mg/dL
HGB URINE DIPSTICK: NEGATIVE
Ketones, ur: NEGATIVE mg/dL
Leukocytes, UA: NEGATIVE
Nitrite: NEGATIVE
PH: 8 (ref 5.0–8.0)
Protein, ur: 30 mg/dL — AB
SPECIFIC GRAVITY, URINE: 1.019 (ref 1.005–1.030)
WBC UA: NONE SEEN WBC/hpf (ref 0–5)

## 2017-09-23 LAB — CBC
HEMATOCRIT: 36.5 % (ref 36.0–46.0)
Hemoglobin: 12.3 g/dL (ref 12.0–15.0)
MCH: 27.3 pg (ref 26.0–34.0)
MCHC: 33.7 g/dL (ref 30.0–36.0)
MCV: 81.1 fL (ref 78.0–100.0)
Platelets: 168 10*3/uL (ref 150–400)
RBC: 4.5 MIL/uL (ref 3.87–5.11)
RDW: 14.7 % (ref 11.5–15.5)
WBC: 6.8 10*3/uL (ref 4.0–10.5)

## 2017-09-23 LAB — POCT PREGNANCY, URINE: Preg Test, Ur: POSITIVE — AB

## 2017-09-23 LAB — WET PREP, GENITAL
Clue Cells Wet Prep HPF POC: NONE SEEN
Sperm: NONE SEEN
Trich, Wet Prep: NONE SEEN
YEAST WET PREP: NONE SEEN

## 2017-09-23 LAB — HCG, QUANTITATIVE, PREGNANCY: hCG, Beta Chain, Quant, S: 25422 m[IU]/mL — ABNORMAL HIGH (ref <5)

## 2017-09-23 NOTE — Discharge Instructions (Signed)
Hemorragia vaginal durante el embarazo (primer trimestre) (Vaginal Bleeding During Pregnancy, First Trimester) Durante los primeros meses de [place], es comn tener una pequea hemorragia vaginal (manchas). A veces, la hemorragia es normal y no representa un problema, pero en algunas ocasiones es un sntoma de algo grave. Asegrese de decirle a su mdico de inmediato si tiene algn tipo de hemorragia vaginal. CUIDADOS EN EL HOGAR  Controle su afeccin para ver si hay cambios.  Siga las indicaciones de su mdico con respecto al [place] de actividad que [place].  Si debe hacer reposo en cama: ? Es posible que deba quedarse en cama y levantarse nicamente para ir al bao. ? Building control surveyor [employer]. ? Si es necesario, planifique que alguien la ayude.  Alla German: ? La cantidad de toallas higinicas que [place] cada da. ? La frecuencia con la que se cambia las toallas higinicas. ? Indique que tan empapados (saturados) estn.  No use tampones.  No se haga duchas vaginales.  No tenga relaciones sexuales ni orgasmos hasta que el mdico la autorice.  Si elimina tejido por la vagina, gurdelo para mostrrselo al [employer].  Tome los medicamentos solamente como se lo haya indicado el mdico.  No tome aspirina, ya que puede causar hemorragias.  Concurra a todas las visitas de control como se lo haya indicado el mdico. SOLICITE AYUDA SI:  Tiene una hemorragia vaginal.  Tiene clicos.  Tiene dolores de [place].  Tiene fiebre que no desaparece despus de Geophysical data processor. SOLICITE AYUDA DE INMEDIATO SI:  Siente clicos muy intensos en la espalda o en el vientre (abdomen).  Elimina cogulos grandes o tejido por la vagina.  Tiene ms hemorragia.  Se siente dbil o que va a desvanecerse.  Pierde el conocimiento (se desmaya).  Tiene escalofros.  Tiene una prdida importante o sale lquido a borbotones por la vagina.  Se desmaya mientras defeca. ASEGRESE DE  QUE:  Comprende estas instrucciones.  Controlar su afeccin.  Recibir ayuda de inmediato si no mejora o si empeora. Esta informacin no tiene Marine scientist el consejo del mdico. Asegrese de hacerle al mdico cualquier pregunta que tenga. Document Released: 03/18/2014 Document Revised: 03/18/2014 Document Reviewed: 07/09/2013 Elsevier Interactive Patient Education  [employer].

## 2017-09-23 NOTE — MAU Note (Signed)
Pt reports she is [redacted] weeks pregnant and she is having some bleeding.

## 2017-09-23 NOTE — MAU Provider Note (Signed)
History     CSN: [id number]  Arrival date and time: 09/23/17 1404   First Provider Initiated Contact with Patient 09/23/17 1449      Chief Complaint  Patient presents with  . Possible Pregnancy  . Vaginal Bleeding  . Abdominal Pain   HPI Robyn Harvey is a 26 y.o. G2P0010 at [redacted]w[redacted]d by LMP who presents with vaginal bleeding. Had +HPT confirmed at [facility].  Reports red/brown spotting on toilet paper since this morning. Bleeding has improved. More recently noticed small tan clots on toilet paper. Is not bleeding into underwear. Denies abdominal pain. Last intercourse was 2 days ago. Last BM this morning; reports having to strain d/t constipation.   OB History    Gravida Para Term Preterm AB Living   2       1     SAB TAB Ectopic Multiple Live Births   1              No past medical history on file.  No past surgical history on file.  No family history on file.  Social History   Tobacco Use  . Smoking status: Never Smoker  . Smokeless tobacco: Never Used  Substance Use Topics  . Alcohol use: No  . Drug use: No    Allergies: No Known Allergies  Medications Prior to Admission  Medication Sig Dispense Refill Last Dose  . Prenatal Vit-Fe Fumarate-FA (PRENATAL MULTIVITAMIN) TABS tablet Take 1 tablet daily at 12 noon by mouth.   09/23/2017 at Unknown time    Review of Systems  Constitutional: Negative.   Gastrointestinal: Positive for constipation. Negative for abdominal pain, blood in stool, nausea and vomiting.  Genitourinary: Positive for vaginal bleeding. Negative for dysuria and vaginal discharge.   Physical Exam   Blood pressure (!) 105/59, pulse 96, temperature 98.4 F (36.9 C), temperature source Oral, resp. rate 16, height 5\' 3"  (1.6 m), weight 134 lb (60.8 kg), last menstrual period 08/14/2017, SpO2 100 %.  Physical Exam  Nursing note and vitals reviewed. Constitutional: She is oriented to person, place, and time. She appears well-developed and  well-nourished. No distress.  HENT:  Head: Normocephalic and atraumatic.  Eyes: Conjunctivae are normal. Right eye exhibits no discharge. Left eye exhibits no discharge. No scleral icterus.  Neck: Normal range of motion.  Respiratory: Effort normal. No respiratory distress.  GI: Soft. There is no tenderness.  Genitourinary: Uterus normal. Cervix exhibits no motion tenderness and no friability. Right adnexum displays no mass and no tenderness. Left adnexum displays no mass and no tenderness. No bleeding in the vagina. Vaginal discharge (minimal amount of tan discharge; no bleeding noted) found.  Genitourinary Comments: Cervix closed  Neurological: She is alert and oriented to person, place, and time.  Skin: Skin is warm and dry. She is not diaphoretic.  Psychiatric: She has a normal mood and affect. Her behavior is normal. Judgment and thought content normal.    MAU Course  Procedures Results for orders placed or performed during the hospital encounter of 09/23/17 (from the past 24 hour(s))  Urinalysis, Routine w reflex microscopic     Status: Abnormal   Collection Time: 09/23/17  2:20 PM  Result Value Ref Range   Color, Urine YELLOW YELLOW   APPearance CLOUDY (A) CLEAR   Specific Gravity, Urine 1.019 1.005 - 1.030   pH 8.0 5.0 - 8.0   Glucose, UA NEGATIVE NEGATIVE mg/dL   Hgb urine dipstick NEGATIVE NEGATIVE   Bilirubin Urine NEGATIVE NEGATIVE   Ketones,  ur NEGATIVE NEGATIVE mg/dL   Protein, ur 30 (A) NEGATIVE mg/dL   Nitrite NEGATIVE NEGATIVE   Leukocytes, UA NEGATIVE NEGATIVE   RBC / HPF 0-5 0 - 5 RBC/hpf   WBC, UA NONE SEEN 0 - 5 WBC/hpf   Bacteria, UA RARE (A) NONE SEEN   Squamous Epithelial / LPF 0-5 (A) NONE SEEN   WBC Clumps PRESENT    Mucus PRESENT   Wet prep, genital     Status: Abnormal   Collection Time: 09/23/17  2:20 PM  Result Value Ref Range   Yeast Wet Prep HPF POC NONE SEEN NONE SEEN   Trich, Wet Prep NONE SEEN NONE SEEN   Clue Cells Wet Prep HPF POC NONE  SEEN NONE SEEN   WBC, Wet Prep HPF POC FEW (A) NONE SEEN   Sperm NONE SEEN   Pregnancy, urine POC     Status: Abnormal   Collection Time: 09/23/17  2:44 PM  Result Value Ref Range   Preg Test, Ur POSITIVE (A) NEGATIVE  CBC     Status: None   Collection Time: 09/23/17  3:34 PM  Result Value Ref Range   WBC 6.8 4.0 - 10.5 K/uL   RBC 4.50 3.87 - 5.11 MIL/uL   Hemoglobin 12.3 12.0 - 15.0 g/dL   HCT 36.5 36.0 - 46.0 %   MCV 81.1 78.0 - 100.0 fL   MCH 27.3 26.0 - 34.0 pg   MCHC 33.7 30.0 - 36.0 g/dL   RDW 14.7 11.5 - 15.5 %   Platelets 168 150 - 400 K/uL  ABO/Rh     Status: None (Preliminary result)   Collection Time: 09/23/17  3:34 PM  Result Value Ref Range   ABO/RH(D) O POS   hCG, quantitative, pregnancy     Status: Abnormal   Collection Time: 09/23/17  3:34 PM  Result Value Ref Range   hCG, Beta Chain, Quant, S 25,422 (H) <5 mIU/mL   US Ob Comp Less 14 Wks  Result Date: 09/23/2017 CLINICAL DATA:  Pelvic pain and vaginal bleeding beginning today. Gestational age by LMP of 5 weeks 5 days. EXAM: OBSTETRIC <14 WK [place] AND TRANSVAGINAL OB US TECHNIQUE: Both transabdominal and transvaginal ultrasound examinations were performed for complete evaluation of the gestation as well as the maternal uterus, adnexal regions, and pelvic cul-de-sac. Transvaginal technique was performed to assess early pregnancy. COMPARISON:  None. FINDINGS: Intrauterine gestational sac: Single Yolk sac:  Visualized. Embryo:  Visualized. Cardiac Activity: Visualized. Heart Rate: 107  bpm CRL:  5  mm   6 w   1 d                  [place] EDC: 05/18/2018 Subchorionic hemorrhage:  None visualized. Maternal uterus/adnexae: Myometrial septum is seen in the fundal region, with IUP in the right uterine horn. 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 A small fibroid is seen in the right anterior lower uterine segment which 1.6 cm in maximum diameter. The left ovary is normal in appearance. Right ovary not directly visualized,  however no adnexal mass or abnormal free fluid identified. IMPRESSION: Single living IUP with estimated gestational age of [redacted] weeks 1 day. This is concordant with LMP. 1.6 cm fibroid in right anterior lower uterine segment. 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 Electronically Signed   By: Earle Gell M.D.   On: 09/23/2017 16:29   US Ob Transvaginal  Result Date: 09/23/2017 CLINICAL DATA:  Pelvic pain and vaginal bleeding beginning today. Gestational age by LMP  of 5 weeks 5 days. EXAM: OBSTETRIC <14 WK [place] AND TRANSVAGINAL OB US TECHNIQUE: Both transabdominal and transvaginal ultrasound examinations were performed for complete evaluation of the gestation as well as the maternal uterus, adnexal regions, and pelvic cul-de-sac. Transvaginal technique was performed to assess early pregnancy. COMPARISON:  None. FINDINGS: Intrauterine gestational sac: Single Yolk sac:  Visualized. Embryo:  Visualized. Cardiac Activity: Visualized. Heart Rate: 107  bpm CRL:  5  mm   6 w   1 d                  [place] EDC: 05/18/2018 Subchorionic hemorrhage:  None visualized. Maternal uterus/adnexae: Myometrial septum is seen in the fundal region, with IUP in the right uterine horn. 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 A small fibroid is seen in the right anterior lower uterine segment which 1.6 cm in maximum diameter. The left ovary is normal in appearance. Right ovary not directly visualized, however no adnexal mass or abnormal free fluid identified. IMPRESSION: Single living IUP with estimated gestational age of [redacted] weeks 1 day. This is concordant with LMP. 1.6 cm fibroid in right anterior lower uterine segment. 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 Electronically Signed   By: Earle Gell M.D.   On: 09/23/2017 16:29    MDM +UPT UA, wet prep, GC/chlamydia, CBC, ABO/Rh, quant hCG, HIV, and [place] today to rule out ectopic pregnancy O positive Ultrasound shows SIUP with cardiac activity. Small fibroid in LUS. 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  Discussed results with patient via Spanish interpreter at bedside  Assessment and Plan  A: 1. Normal IUP (intrauterine pregnancy) on prenatal ultrasound, first trimester   2. Vaginal bleeding in pregnancy, first trimester   3. Less than [redacted] weeks gestation of pregnancy   4. Uterine fibroid during pregnancy, antepartum    P: Discharge home Start prenatal care with [facility] GC/CT pending Pelvic rest Discussed reasons to return to [place] 09/23/2017, 2:49 PM

## 2017-09-24 LAB — ABO/RH: ABO/RH(D): O POS

## 2017-09-24 LAB — HIV ANTIBODY (ROUTINE TESTING W REFLEX): HIV Screen 4th Generation wRfx: NONREACTIVE

## 2017-09-26 LAB — GC/CHLAMYDIA PROBE AMP (~~LOC~~) NOT AT ARMC
Chlamydia: NEGATIVE
Neisseria Gonorrhea: NEGATIVE

## 2017-10-01 ENCOUNTER — Inpatient Hospital Stay (HOSPITAL_COMMUNITY)
Admission: AD | Admit: 2017-10-01 | Discharge: 2017-10-02 | Disposition: A | Discharge status: Home / Self Care | Payer: Self-pay | Source: Ambulatory Visit | Attending: Obstetrics and Gynecology | Admitting: Obstetrics and Gynecology

## 2017-10-01 DIAGNOSIS — O209 Hemorrhage in early pregnancy, unspecified: Secondary | ICD-10-CM

## 2017-10-01 DIAGNOSIS — O208 Other hemorrhage in early pregnancy: Secondary | ICD-10-CM | POA: Insufficient documentation

## 2017-10-01 DIAGNOSIS — Z3A01 Less than 8 weeks gestation of pregnancy: Secondary | ICD-10-CM | POA: Insufficient documentation

## 2017-10-01 DIAGNOSIS — O3411 Maternal care for benign tumor of corpus uteri, first trimester: Secondary | ICD-10-CM | POA: Insufficient documentation

## 2017-10-01 DIAGNOSIS — D259 Leiomyoma of uterus, unspecified: Secondary | ICD-10-CM | POA: Insufficient documentation

## 2017-10-01 HISTORY — DX: Umbilical hernia without obstruction or gangrene: K42.9

## 2017-10-01 HISTORY — DX: Benign neoplasm of connective and other soft tissue, unspecified: D21.9

## 2017-10-02 ENCOUNTER — Encounter (HOSPITAL_COMMUNITY): Payer: Self-pay

## 2017-10-02 ENCOUNTER — Inpatient Hospital Stay (HOSPITAL_COMMUNITY): Payer: Self-pay

## 2017-10-02 ENCOUNTER — Other Ambulatory Visit: Payer: Self-pay

## 2017-10-02 DIAGNOSIS — O209 Hemorrhage in early pregnancy, unspecified: Secondary | ICD-10-CM

## 2017-10-02 DIAGNOSIS — Z3A01 Less than 8 weeks gestation of pregnancy: Secondary | ICD-10-CM

## 2017-10-02 LAB — URINALYSIS, DIPSTICK ONLY
BILIRUBIN URINE: NEGATIVE
Glucose, UA: NEGATIVE mg/dL
Ketones, ur: 15 mg/dL — AB
Leukocytes, UA: NEGATIVE
NITRITE: NEGATIVE
PH: 5.5 (ref 5.0–8.0)
Protein, ur: NEGATIVE mg/dL
SPECIFIC GRAVITY, URINE: 1.02 (ref 1.005–1.030)

## 2017-10-02 MED ORDER — FAMOTIDINE 20 MG PO TABS: 20.0000 mg | ORAL_TABLET | Freq: Two times a day (BID) | ORAL | 0 refills | Status: DC

## 2017-10-02 NOTE — Discharge Instructions (Signed)
Hemorragia vaginal durante el embarazo (primer trimestre) (Vaginal Bleeding During Pregnancy, First Trimester) Durante los primeros meses de [place], es comn tener una pequea hemorragia vaginal (manchas). A veces, la hemorragia es normal y no representa un problema, pero en algunas ocasiones es un sntoma de algo grave. Asegrese de decirle a su mdico de inmediato si tiene algn tipo de hemorragia vaginal. CUIDADOS EN EL HOGAR  Controle su afeccin para ver si hay cambios.  Siga las indicaciones de su mdico con respecto al [place] de actividad que [place].  Si debe hacer reposo en cama: ? Es posible que deba quedarse en cama y levantarse nicamente para ir al bao. ? Building control surveyor [employer]. ? Si es necesario, planifique que alguien la ayude.  Alla German: ? La cantidad de toallas higinicas que [place] cada da. ? La frecuencia con la que se cambia las toallas higinicas. ? Indique que tan empapados (saturados) estn.  No use tampones.  No se haga duchas vaginales.  No tenga relaciones sexuales ni orgasmos hasta que el mdico la autorice.  Si elimina tejido por la vagina, gurdelo para mostrrselo al [employer].  Tome los medicamentos solamente como se lo haya indicado el mdico.  No tome aspirina, ya que puede causar hemorragias.  Concurra a todas las visitas de control como se lo haya indicado el mdico. SOLICITE AYUDA SI:  Tiene una hemorragia vaginal.  Tiene clicos.  Tiene dolores de [place].  Tiene fiebre que no desaparece despus de Geophysical data processor. SOLICITE AYUDA DE INMEDIATO SI:  Siente clicos muy intensos en la espalda o en el vientre (abdomen).  Elimina cogulos grandes o tejido por la vagina.  Tiene ms hemorragia.  Se siente dbil o que va a desvanecerse.  Pierde el conocimiento (se desmaya).  Tiene escalofros.  Tiene una prdida importante o sale lquido a borbotones por la vagina.  Se desmaya mientras defeca. ASEGRESE DE  QUE:  Comprende estas instrucciones.  Controlar su afeccin.  Recibir ayuda de inmediato si no mejora o si empeora. Esta informacin no tiene Marine scientist el consejo del mdico. Asegrese de hacerle al mdico cualquier pregunta que tenga. Document Released: 03/18/2014 Document Revised: 03/18/2014 Document Reviewed: 07/09/2013 Elsevier Interactive Patient Education  [employer].

## 2017-10-02 NOTE — MAU Provider Note (Signed)
History   [id number]   Chief Complaint  Patient presents with  . Vaginal Bleeding    HPI Robyn Harvey is a 26 y.o. female  G2P0010 at [redacted]w[redacted]d IUP here with report of gush of blood this evening.  Also reports pain in left groin area that radiates to lower pelvis.  Previous ultrasound on 09/23/17 showed IUP at 6.1 wks with FHR 107.  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    Patient's last menstrual period was 08/14/2017.  OB History  Gravida Para Term Preterm AB Living  2       1    SAB TAB Ectopic Multiple Live Births  1            # Outcome Date GA Lbr Len/2nd Weight Sex Delivery Anes PTL Lv  2 Current           1 SAB               Past Medical History:  Diagnosis Date  . Fibroid   . Hernia, umbilical     Family History  Problem Relation Age of Onset  . Hypertension Mother     Social History   Socioeconomic History  . Marital status: Married    Spouse name: None  . Number of children: None  . Years of education: None  . Highest education level: None  Social Needs  . Financial resource strain: None  . Food insecurity - worry: None  . Food insecurity - inability: None  . Transportation needs - medical: None  . Transportation needs - non-medical: None  Occupational History  . None  Tobacco Use  . Smoking status: Never Smoker  . Smokeless tobacco: Never Used  Substance and Sexual Activity  . Alcohol use: No  . Drug use: No  . Sexual activity: No  Other Topics Concern  . None  Social History Narrative   ** Merged History Encounter **        No Known Allergies  No current facility-administered medications on file prior to encounter.    Current Outpatient Medications on File Prior to Encounter  Medication Sig Dispense Refill  . Prenatal Vit-Fe Fumarate-FA (PRENATAL MULTIVITAMIN) TABS tablet Take 1 tablet daily at 12 noon by mouth.       Review of Systems   Gastrointestinal: Negative for nausea and vomiting.  Genitourinary: Positive for pelvic pain and vaginal bleeding.  All other systems reviewed and are negative.    Physical Exam   Vitals:   10/02/17 0045  BP: 120/68  Pulse: 100  Resp: 16  Temp: 98.3 F (36.8 C)  TempSrc: Oral  SpO2: 100%    Physical Exam  Constitutional: She is oriented to person, place, and time. She appears well-developed and well-nourished. No distress.  HENT:  Head: Normocephalic.  Neck: Normal range of motion. Neck supple.  Cardiovascular: Normal rate, regular rhythm and normal heart sounds.  Respiratory: Effort normal and breath sounds normal. No respiratory distress.  GI: Soft. She exhibits no mass. There is no tenderness. There is no rebound and no guarding.  Genitourinary: Uterus is enlarged. Right adnexum displays no mass, no tenderness and no fullness. Left adnexum displays no mass, no tenderness and no fullness. No bleeding in the vagina.  Musculoskeletal: Normal range of motion.  Neurological: She is alert and oriented to person, place, and time.  Skin: Skin is warm and dry.    MAU Course  Procedures  MDM  Ultrasound IMPRESSION: Intrauterine gestational sac: Single; visualized and normal in shape.  Yolk sac:  Yes  Embryo:  Yes  Cardiac Activity: Yes  Heart Rate: 154 bpm  CRL:   1.15 cm   7 w 1 d                  [place] EDC: 05/20/2018  Subchorionic hemorrhage: A small amount of subchorionic hemorrhage is noted.  Assessment and Plan  26 y.o. G2P0010 at [redacted]w[redacted]d IUP  Subchorionic Hemorrhage Uterine Fibroid  Plan: Discharge home Reviewed bleeding precautions  Gwen Pounds, CNM 10/02/2017 2:00 AM

## 2017-10-02 NOTE — MAU Note (Signed)
Has had some brown spotting earlier in week but about 20 min ago, small gush of vaginal bleeding and now just a small amt in MAU bathroom. On and off having pain in left hip and sometimes shoots to low abd area.

## 2017-10-09 ENCOUNTER — Encounter (HOSPITAL_COMMUNITY): Payer: Self-pay

## 2017-10-09 ENCOUNTER — Inpatient Hospital Stay (HOSPITAL_COMMUNITY)
Admission: AD | Admit: 2017-10-09 | Discharge: 2017-10-09 | Disposition: A | Discharge status: Home / Self Care | Payer: Self-pay | Source: Ambulatory Visit | Attending: Obstetrics and Gynecology | Admitting: Obstetrics and Gynecology

## 2017-10-09 DIAGNOSIS — Z3A00 Weeks of gestation of pregnancy not specified: Secondary | ICD-10-CM | POA: Insufficient documentation

## 2017-10-09 DIAGNOSIS — R109 Unspecified abdominal pain: Secondary | ICD-10-CM | POA: Insufficient documentation

## 2017-10-09 DIAGNOSIS — O209 Hemorrhage in early pregnancy, unspecified: Secondary | ICD-10-CM

## 2017-10-09 DIAGNOSIS — O4691 Antepartum hemorrhage, unspecified, first trimester: Secondary | ICD-10-CM | POA: Insufficient documentation

## 2017-10-09 NOTE — Progress Notes (Addendum)
G2P0 @ [redacted] wksga. Presents to tragae for heavy bleeding that started today at 1530.   Last period:  Sept 30  Dx last week with +IUP and followed with U/S with confirmation of sac and FH. Had a subchorionic bleed at that time.   Provider at bs assessing and bs sono done. FH detected.   1642: Discharge instructions given with pt understanding with use of interpreter. Pt left unit via ambulatory with SO

## 2017-10-09 NOTE — MAU Provider Note (Signed)
History   G2P0010 in with c/o bright vag bleeding that started this afternoon. Small amt bright bleeding nop clots.  CSN: [id number]  Arrival date & time 10/09/17  1601   None     Chief Complaint  Patient presents with  . Vaginal Bleeding  . Abdominal Pain    HPI  Past Medical History:  Diagnosis Date  . Fibroid   . Hernia, umbilical     No past surgical history on file.  Family History  Problem Relation Age of Onset  . Hypertension Mother     Social History   Tobacco Use  . Smoking status: Never Smoker  . Smokeless tobacco: Never Used  Substance Use Topics  . Alcohol use: No  . Drug use: No    OB History    Gravida Para Term Preterm AB Living   2 0     1     SAB TAB Ectopic Multiple Live Births   1              Review of Systems  Constitutional: Negative.   HENT: Negative.   Eyes: Negative.   Respiratory: Negative.   Cardiovascular: Negative.   Gastrointestinal: Negative.   Endocrine: Negative.   Genitourinary: Positive for vaginal bleeding.  Musculoskeletal: Negative.   Skin: Negative.   Allergic/Immunologic: Negative.   Neurological: Negative.   Hematological: Negative.   Psychiatric/Behavioral: Negative.     Allergies  Patient has no known allergies.  Home Medications    BP 113/66 (BP Location: Right Arm)   Pulse (!) 104   Temp 98.1 F (36.7 C) (Oral)   Resp 18   Ht 5\' 3"  (1.6 m)   Wt 132 lb (59.9 kg)   LMP 08/14/2017   SpO2 96%   BMI 23.38 kg/m   Physical Exam  Constitutional: She is oriented to person, place, and time. She appears well-developed and well-nourished.  HENT:  Head: Normocephalic.  Eyes: Pupils are equal, round, and reactive to light.  Neck: Normal range of motion.  Cardiovascular: Normal rate, regular rhythm, normal heart sounds and intact distal pulses.  Pulmonary/Chest: Effort normal and breath sounds normal.  Abdominal: Soft. Bowel sounds are normal.  Genitourinary:  Genitourinary Comments: Scant amt  bright vag bleeding.  Musculoskeletal: Normal range of motion.  Neurological: She is alert and oriented to person, place, and time. She has normal reflexes.  Skin: Skin is warm and dry.  Psychiatric: She has a normal mood and affect. Her behavior is normal. Judgment and thought content normal.    MAU Course  Procedures (including critical care time)  Labs Reviewed - No data to display No results found.   1. Vaginal bleeding in pregnancy, first trimester       MDM  VSS, bedside US showed viable fetus with strong FHR and pos fetal movement. Pelvic rest and to keep appt on 10/20/17 to start care. Will d/c home

## 2017-10-09 NOTE — Discharge Instructions (Signed)
Hemorragia vaginal durante el embarazo (primer trimestre) (Vaginal Bleeding During Pregnancy, First Trimester) Durante los primeros meses de [place], es comn tener una pequea hemorragia vaginal (manchas). A veces, la hemorragia es normal y no representa un problema, pero en algunas ocasiones es un sntoma de algo grave. Asegrese de decirle a su mdico de inmediato si tiene algn tipo de hemorragia vaginal. CUIDADOS EN EL HOGAR  Controle su afeccin para ver si hay cambios.  Siga las indicaciones de su mdico con respecto al [place] de actividad que [place].  Si debe hacer reposo en cama: ? Es posible que deba quedarse en cama y levantarse nicamente para ir al bao. ? Building control surveyor [employer]. ? Si es necesario, planifique que alguien la ayude.  Alla German: ? La cantidad de toallas higinicas que [place] cada da. ? La frecuencia con la que se cambia las toallas higinicas. ? Indique que tan empapados (saturados) estn.  No use tampones.  No se haga duchas vaginales.  No tenga relaciones sexuales ni orgasmos hasta que el mdico la autorice.  Si elimina tejido por la vagina, gurdelo para mostrrselo al [employer].  Tome los medicamentos solamente como se lo haya indicado el mdico.  No tome aspirina, ya que puede causar hemorragias.  Concurra a todas las visitas de control como se lo haya indicado el mdico. SOLICITE AYUDA SI:  Tiene una hemorragia vaginal.  Tiene clicos.  Tiene dolores de [place].  Tiene fiebre que no desaparece despus de Geophysical data processor. SOLICITE AYUDA DE INMEDIATO SI:  Siente clicos muy intensos en la espalda o en el vientre (abdomen).  Elimina cogulos grandes o tejido por la vagina.  Tiene ms hemorragia.  Se siente dbil o que va a desvanecerse.  Pierde el conocimiento (se desmaya).  Tiene escalofros.  Tiene una prdida importante o sale lquido a borbotones por la vagina.  Se desmaya mientras defeca. ASEGRESE DE  QUE:  Comprende estas instrucciones.  Controlar su afeccin.  Recibir ayuda de inmediato si no mejora o si empeora. Esta informacin no tiene Marine scientist el consejo del mdico. Asegrese de hacerle al mdico cualquier pregunta que tenga. Document Released: 03/18/2014 Document Revised: 03/18/2014 Document Reviewed: 07/09/2013 Elsevier Interactive Patient Education  [employer].

## 2017-10-09 NOTE — MAU Note (Signed)
Pt reports vaginal bleeding x 30 minutes , sharp lower abd pain.

## 2017-11-03 ENCOUNTER — Ambulatory Visit (INDEPENDENT_AMBULATORY_CARE_PROVIDER_SITE_OTHER): Payer: Self-pay | Admitting: Certified Nurse Midwife

## 2017-11-03 ENCOUNTER — Encounter: Payer: Self-pay | Admitting: Certified Nurse Midwife

## 2017-11-03 VITALS — BP 109/73 | HR 99 | Wt 130.2 lb

## 2017-11-03 DIAGNOSIS — O0990 Supervision of high risk pregnancy, unspecified, unspecified trimester: Secondary | ICD-10-CM | POA: Insufficient documentation

## 2017-11-03 DIAGNOSIS — Z124 Encounter for screening for malignant neoplasm of cervix: Secondary | ICD-10-CM

## 2017-11-03 DIAGNOSIS — Z3480 Encounter for supervision of other normal pregnancy, unspecified trimester: Secondary | ICD-10-CM

## 2017-11-03 DIAGNOSIS — O219 Vomiting of pregnancy, unspecified: Secondary | ICD-10-CM

## 2017-11-03 DIAGNOSIS — Z113 Encounter for screening for infections with a predominantly sexual mode of transmission: Secondary | ICD-10-CM

## 2017-11-03 DIAGNOSIS — Z3481 Encounter for supervision of other normal pregnancy, first trimester: Secondary | ICD-10-CM

## 2017-11-03 MED ORDER — PROMETHAZINE HCL 12.5 MG PO TABS: 12.5000 mg | ORAL_TABLET | Freq: Four times a day (QID) | ORAL | 0 refills | Status: DC | PRN

## 2017-11-03 NOTE — Addendum Note (Signed)
Addended by: Margurite Auerbach, Bethzaida Boord A on: 11/03/2017 04:00 PM   Modules accepted: Orders

## 2017-11-03 NOTE — Progress Notes (Signed)
Patient is in the office for initial ob visit. Pt is married and this is a planned pregnancy.

## 2017-11-03 NOTE — Progress Notes (Signed)
Subjective:   Robyn Harvey is a 26 y.o. G2P0010 at [redacted]w[redacted]d by LMP, early ultrasound being seen today for her first obstetrical visit.  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 Patient does intend to breast feed. Pregnancy history fully reviewed.  Patient reports nausea, no bleeding, no contractions, no cramping and no leaking.  HISTORY: Obstetric History   G2   P0   T0   P0   A1   L0    SAB1   TAB0   Ectopic0   Multiple0   Live Births0     # Outcome Date GA Lbr Len/2nd Weight Sex Delivery Anes PTL Lv  2 Current           1 SAB 11/08/16 [redacted]w[redacted]d            Past Medical History:  Diagnosis Date  . Fibroid   . Hernia, umbilical    History reviewed. No pertinent surgical history. Family History  Problem Relation Age of Onset  . Hypertension Mother    Social History   Tobacco Use  . Smoking status: Never Smoker  . Smokeless tobacco: Never Used  Substance Use Topics  . Alcohol use: No  . Drug use: No   No Known Allergies Current Outpatient Medications on File Prior to Visit  Medication Sig Dispense Refill  . Prenatal Vit-Fe Fumarate-FA (PRENATAL MULTIVITAMIN) TABS tablet Take 1 tablet daily at 12 noon by mouth.    . famotidine (PEPCID) 20 MG tablet Take 1 tablet (20 mg total) 2 (two) times daily by mouth. (Patient not taking: Reported on 11/03/2017) 30 tablet 0   No current facility-administered medications on file prior to visit.      Exam   Vitals:   11/03/17 1432  BP: 109/73  Pulse: 99  Weight: 130 lb 3.2 oz (59.1 kg)   Fetal Heart Rate (bpm): 164; doppler  Uterus:   gravid  Pelvic Exam: Perineum: no hemorrhoids, normal perineum   Vulva: normal external genitalia, no lesions   Vagina:  normal mucosa, normal discharge   Cervix: no lesions and normal, pap smear done.    Adnexa: normal adnexa and no mass, fullness, tenderness   Bony Pelvis:  average  System: General: well-developed, well-nourished female in no acute distress   Breast:  normal appearance, no masses or tenderness, flat nipples   Skin: normal coloration and turgor, no rashes   Neurologic: oriented, normal, negative, normal mood   Extremities: normal strength, tone, and muscle mass, ROM of all joints is normal   HEENT PERRLA, extraocular movement intact and sclera clear, anicteric   Mouth/Teeth mucous membranes moist, pharynx normal without lesions and dental hygiene good   Neck supple and no masses   Cardiovascular: regular rate and rhythm   Respiratory:  no respiratory distress, normal breath sounds   Abdomen: soft, non-tender; bowel sounds normal; no masses,  no organomegaly     Assessment:   Pregnancy: G2P0010 Patient Active Problem List   Diagnosis Date Noted  . Supervision of other normal pregnancy, antepartum 11/03/2017     Plan:  1. Supervision of other normal pregnancy, antepartum     - Cytology - PAP - Cervicovaginal ancillary only - Obstetric Panel, Including HIV - Hemoglobinopathy evaluation - Vitamin D (25 hydroxy) - Culture, OB Urine - HgB A1c  Flat nipples: will need help with breast feeding.  Hx of umbilical hernia: may need surgical intervention after  pregnancy.   Initial labs drawn. Continue prenatal vitamins. Genetic Screening discussed, NIPS: declined Mat21 d/t cost. Ultrasound discussed; fetal anatomic survey: requested. Problem list reviewed and updated. The nature of [facility] with multiple MDs and other Advanced Practice Providers was explained to patient; also emphasized that residents, students are part of our team. Routine obstetric precautions reviewed. Return in about 4 weeks (around 12/01/2017) for ROB.     Kandis Cocking, [employer] for [employer], [place]

## 2017-11-04 LAB — CERVICOVAGINAL ANCILLARY ONLY
Bacterial vaginitis: NEGATIVE
CANDIDA VAGINITIS: NEGATIVE
Chlamydia: NEGATIVE
Neisseria Gonorrhea: NEGATIVE
TRICH (WINDOWPATH): NEGATIVE

## 2017-11-05 LAB — CULTURE, OB URINE

## 2017-11-05 LAB — URINE CULTURE, OB REFLEX

## 2017-11-07 LAB — OBSTETRIC PANEL, INCLUDING HIV
ANTIBODY SCREEN: NEGATIVE
Basophils Absolute: 0 10*3/uL (ref 0.0–0.2)
Basos: 0 %
EOS (ABSOLUTE): 0.1 10*3/uL (ref 0.0–0.4)
EOS: 1 %
HEMATOCRIT: 37.9 % (ref 34.0–46.6)
HEMOGLOBIN: 12.5 g/dL (ref 11.1–15.9)
HIV Screen 4th Generation wRfx: NONREACTIVE
Hepatitis B Surface Ag: NEGATIVE
Immature Grans (Abs): 0 10*3/uL (ref 0.0–0.1)
Immature Granulocytes: 0 %
LYMPHS ABS: 1.6 10*3/uL (ref 0.7–3.1)
Lymphs: 22 %
MCH: 27.6 pg (ref 26.6–33.0)
MCHC: 33 g/dL (ref 31.5–35.7)
MCV: 84 fL (ref 79–97)
MONOS ABS: 0.3 10*3/uL (ref 0.1–0.9)
Monocytes: 5 %
NEUTROS ABS: 5.1 10*3/uL (ref 1.4–7.0)
Neutrophils: 72 %
Platelets: 171 10*3/uL (ref 150–379)
RBC: 4.53 x10E6/uL (ref 3.77–5.28)
RDW: 15.3 % (ref 12.3–15.4)
RH TYPE: POSITIVE
RPR Ser Ql: NONREACTIVE
Rubella Antibodies, IGG: 9.17 index (ref >0.99)
WBC: 7.1 10*3/uL (ref 3.4–10.8)

## 2017-11-07 LAB — HEMOGLOBINOPATHY EVALUATION
HGB C: 0 %
HGB S: 0 %
HGB VARIANT: 0 %
Hemoglobin A2 Quantitation: 2.5 % (ref 1.8–3.2)
Hemoglobin F Quantitation: 0 % (ref 0.0–2.0)
Hgb A: 97.5 % (ref 96.4–98.8)

## 2017-11-07 LAB — HEMOGLOBIN A1C
Est. average glucose Bld gHb Est-mCnc: 100 mg/dL
Hgb A1c MFr Bld: 5.1 % (ref 4.8–5.6)

## 2017-11-07 LAB — VITAMIN D 25 HYDROXY (VIT D DEFICIENCY, FRACTURES): Vit D, 25-Hydroxy: 34.4 ng/mL (ref 30.0–100.0)

## 2017-11-09 LAB — CYTOLOGY - PAP: Diagnosis: NEGATIVE

## 2017-11-15 ENCOUNTER — Encounter: Payer: Self-pay | Admitting: Family Medicine

## 2017-11-15 DIAGNOSIS — Z789 Other specified health status: Secondary | ICD-10-CM | POA: Insufficient documentation

## 2017-11-15 NOTE — L&D Delivery Note (Signed)
Delivery Note At  a viable female was delivered via  (Presentation:vertex ;LOA  ).  APGAR:7 ,9 ; weight  .   Placenta status: spont, shultz.  Cord:3vc  with the following complications:none .  Cord pH: n/a  Anesthesia:  epidural Episiotomy:  none Lacerations:  2nd Suture Repair: 2.0 vicryl rapide Est. Blood Loss 350(mL):    Mom to postpartum.  Baby to Couplet care / Skin to Skin.  Koren Shiver 04/30/2018, 12:05 PM

## 2017-11-18 ENCOUNTER — Other Ambulatory Visit: Payer: Self-pay | Admitting: Pediatrics

## 2017-11-21 ENCOUNTER — Other Ambulatory Visit: Payer: Self-pay | Admitting: Certified Nurse Midwife

## 2017-11-21 DIAGNOSIS — Z3480 Encounter for supervision of other normal pregnancy, unspecified trimester: Secondary | ICD-10-CM

## 2017-11-21 MED ORDER — FAMOTIDINE 20 MG PO TABS: 20.0000 mg | ORAL_TABLET | Freq: Two times a day (BID) | ORAL | 0 refills | Status: DC

## 2017-12-01 ENCOUNTER — Ambulatory Visit (INDEPENDENT_AMBULATORY_CARE_PROVIDER_SITE_OTHER): Payer: Self-pay | Admitting: Certified Nurse Midwife

## 2017-12-01 ENCOUNTER — Encounter: Payer: Self-pay | Admitting: Certified Nurse Midwife

## 2017-12-01 VITALS — BP 116/72 | HR 69 | Wt 129.1 lb

## 2017-12-01 DIAGNOSIS — O3402 Maternal care for unspecified congenital malformation of uterus, second trimester: Secondary | ICD-10-CM

## 2017-12-01 DIAGNOSIS — Q513 Bicornate uterus: Secondary | ICD-10-CM

## 2017-12-01 DIAGNOSIS — R399 Unspecified symptoms and signs involving the genitourinary system: Secondary | ICD-10-CM

## 2017-12-01 DIAGNOSIS — O3400 Maternal care for unspecified congenital malformation of uterus, unspecified trimester: Secondary | ICD-10-CM | POA: Insufficient documentation

## 2017-12-01 DIAGNOSIS — Z3480 Encounter for supervision of other normal pregnancy, unspecified trimester: Secondary | ICD-10-CM

## 2017-12-01 DIAGNOSIS — Z789 Other specified health status: Secondary | ICD-10-CM

## 2017-12-01 DIAGNOSIS — Z3482 Encounter for supervision of other normal pregnancy, second trimester: Secondary | ICD-10-CM

## 2017-12-01 LAB — POCT URINALYSIS DIPSTICK
Bilirubin, UA: NEGATIVE
Glucose, UA: NEGATIVE
KETONES UA: NEGATIVE
Leukocytes, UA: NEGATIVE
NITRITE UA: NEGATIVE
PH UA: 6 (ref 5.0–8.0)
PROTEIN UA: 1
Spec Grav, UA: 1.02 (ref 1.010–1.025)
UROBILINOGEN UA: 0.2 U/dL

## 2017-12-01 NOTE — Progress Notes (Signed)
   PRENATAL VISIT NOTE  Subjective:  Robyn Harvey is a 27 y.o. G2P0010 at [redacted]w[redacted]d being seen today for ongoing prenatal care.  She is currently monitored for the following issues for this low-risk pregnancy and has Supervision of other normal pregnancy, antepartum; Language barrier; and Bicornate uterus complicating pregnancy on their problem list.  Patient reports backache, no bleeding, no contractions, no cramping, no leaking and urine leakage, discussed normal bowel movements.  Contractions: Not present. Vag. Bleeding: None.   . Denies leaking of fluid.   The following portions of the patient's history were reviewed and updated as appropriate: allergies, current medications, past family history, past medical history, past social history, past surgical history and problem list. Problem list updated.  Objective:   Vitals:   12/01/17 0949  BP: 116/72  Pulse: 69  Weight: 129 lb 1.6 oz (58.6 kg)    Fetal Status: Fetal Heart Rate (bpm): 154; doppler         General:  Alert, oriented and cooperative. Patient is in no acute distress.  Skin: Skin is warm and dry. No rash noted.   Cardiovascular: Normal heart rate noted  Respiratory: Normal respiratory effort, no problems with respiration noted  Abdomen: Soft, gravid, appropriate for gestational age.  Pain/Pressure: Present     Pelvic: Cervical exam deferred        Extremities: Normal range of motion.  Edema: None  Mental Status:  Normal mood and affect. Normal behavior. Normal judgment and thought content.   Assessment and Plan:  Pregnancy: G2P0010 at [redacted]w[redacted]d  1. Supervision of other normal pregnancy, antepartum     Doing well.  Normal discomforts of pregnancy discussed  2. Language barrier     Here with spanish interpreter  3. 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 Please refer to After Visit Summary for other counseling recommendations.  Return in about 4 weeks (around 12/29/2017) for ROB.   Morene Crocker, CNM

## 2017-12-01 NOTE — Addendum Note (Signed)
Addended by: Merryl Hacker on: 12/01/2017 10:33 AM   Modules accepted: Orders

## 2017-12-01 NOTE — Progress Notes (Signed)
Pt c/o diarrhea and constipation. Pt c/o urine leakage.

## 2017-12-01 NOTE — Patient Instructions (Addendum)
Ejercicios de Kegel Barrister's clerk) Los ejercicios de Kegel ayudan a fortalecer los msculos que sostienen el recto, la vagina, el intestino delgado, la vejiga y [place]. Los ejercicios de Kegel pueden ayudar a lo siguiente:  Mejorar el control de la vejiga y de los intestinos.  Mejorar la respuesta sexual.  Reducir los problemas o las molestias durante el [place]. Los ejercicios de Kegel implican apretar los msculos del suelo plvico, que son los mismos msculos que comprime cuando trata de Scientist, water quality el flujo de la [place]. Los ejercicios se pueden Regulatory affairs officer est sentado, parado o [place], pero lo mejor es variar la posicin. EJERCICIOS DE KEGEL 1. Apriete bien los msculos del suelo plvico. Debera sentir una elevacin firme en el rea del recto. Si es [place], tambin debera sentir una compresin en el rea de la vagina. [place], las nalgas y las piernas relajadas. 2. Mantenga los msculos apretados durante 10segundos como mximo. 3. Relaje los msculos. Repita este ejercicio 50veces al da o como se lo haya indicado el mdico. Contine haciendo este ejercicio durante al menos 4 a 6semanas y Doctor, general practice tiempo que le haya indicado el mdico. Esta informacin no tiene Marine scientist el consejo del mdico. Asegrese de hacerle al mdico cualquier pregunta que tenga. Document Released: 10/18/2012 Document Revised: 09/21/2015 Document Reviewed: 09/21/2015 Elsevier Interactive Patient Education  2018 [place] trimestre de Media planner (Second Trimester of Pregnancy) El segundo trimestre va desde la semana13 hasta la 44, desde el cuarto hasta el sexto mes, y suele ser el momento en el que mejor se siente. En general, las nuseas matutinas han disminuido o han desaparecido completamente. Tendr ms energa y podr aumentarle el apetito. El beb por nacer (feto) se desarrolla rpidamente. Hacia el final del sexto mes, el beb mide aproximadamente 9 pulgadas  (23 cm) y pesa alrededor de 1 libras (700 g). Es probable que sienta al beb moverse (dar pataditas) entre las 18 y 17 semanas del Media planner. CUIDADOS EN EL HOGAR  No fume, no consuma hierbas ni beba alcohol. No tome frmacos que el mdico no haya autorizado.  No consuma ningn producto que contenga tabaco, lo que incluye cigarrillos, tabaco de Higher education careers adviser o Psychologist, sport and exercise. Si necesita ayuda para dejar de fumar, consulte al [employer]. Puede recibir asesoramiento u otro tipo de apoyo para dejar de fumar.  Tome los medicamentos solamente como se lo haya indicado el mdico. Algunos medicamentos son seguros para tomar durante el Media planner y otros no lo son.  Haga ejercicios solamente como se lo haya indicado el mdico. Interrumpa la actividad fsica si comienza a tener calambres.  Ingiera alimentos saludables de [place] regular.  Use un sostn que le brinde buen soporte si sus mamas estn sensibles.  No se d baos de inmersin en agua caliente, baos turcos ni saunas.  Colquese el cinturn de seguridad cuando conduzca.  No coma carne cruda ni queso sin cocinar; evite el contacto con las bandejas sanitarias de los gatos y la tierra que estos animales usan.  [place].  Tome entre 1500 y 2000mg  de calcio diariamente comenzando en la [id number] del embarazo [place].  Pruebe tomar un medicamento que la ayude a defecar (un laxante suave) si el mdico lo autoriza. Consuma ms fibra, que se encuentra en las frutas y verduras frescas y los cereales integrales. Beba suficiente lquido para mantener el pis (orina) claro o de color amarillo plido.  Dese baos de asiento con agua tibia para Best boy  o las molestias causadas por las hemorroides. Use una crema para las hemorroides si el mdico la autoriza.  Si se le hinchan las venas (venas varicosas), use medias de descanso. Levante (eleve) los pies durante 55minutos, 3 o 4veces por Training and development officer. Limite el consumo de sal en  su dieta.  No levante objetos pesados, use zapatos de tacones bajos y sintese derecha.  Descanse con las piernas elevadas si tiene calambres o dolor de cintura.  Visite a su dentista si no lo ha [place]ar[place] manager. Use un cepillo de cerdas suaves para cepillarse los dientes. Psese el hilo dental con suavidad.  Puede seguir [employer], a menos que el mdico le indique lo contrario.  Concurra a los controles mdicos.  SOLICITE AYUDA SI:  Siente mareos.  Sufre calambres o presin leves en la parte baja del vientre (abdomen).  Sufre un dolor persistente en el abdomen.  Tiene Higher education careers adviser (nuseas), vmitos, o tiene deposiciones acuosas (diarrea).  Advierte un olor ftido que proviene de la vagina.  Siente dolor al [employer].  SOLICITE AYUDA DE INMEDIATO SI:  Tiene fiebre.  Tiene una prdida de lquido por la vagina.  Tiene sangrado o pequeas prdidas vaginales.  Siente dolor intenso o clicos en el abdomen.  Sube o baja de peso rpidamente.  Tiene dificultades para recuperar el aliento y siente dolor en el pecho.  Sbitamente se le hinchan mucho el rostro, las [place], los tobillos, los pies o las piernas.  No ha sentido los movimientos del beb durante [place].  Siente un dolor de cabeza intenso que no se alivia con medicamentos.  Su visin se modifica.  Esta informacin no tiene Marine scientist el consejo del mdico. Asegrese de hacerle al mdico cualquier pregunta que tenga. Document Released: 07/04/2013 Document Revised: 11/22/2014 Document Reviewed: 01/02/2013 Elsevier Interactive Patient Education  2017 [employer].

## 2017-12-03 LAB — CULTURE, OB URINE

## 2017-12-03 LAB — URINE CULTURE, OB REFLEX

## 2017-12-22 ENCOUNTER — Encounter (HOSPITAL_COMMUNITY): Payer: Self-pay | Admitting: Certified Nurse Midwife

## 2017-12-27 ENCOUNTER — Other Ambulatory Visit: Payer: Self-pay

## 2017-12-27 ENCOUNTER — Ambulatory Visit (INDEPENDENT_AMBULATORY_CARE_PROVIDER_SITE_OTHER): Payer: Self-pay | Admitting: Certified Nurse Midwife

## 2017-12-27 VITALS — BP 116/62 | HR 114 | Temp 98.0°F | Wt 133.5 lb

## 2017-12-27 DIAGNOSIS — R51 Headache: Secondary | ICD-10-CM

## 2017-12-27 DIAGNOSIS — Z3480 Encounter for supervision of other normal pregnancy, unspecified trimester: Secondary | ICD-10-CM

## 2017-12-27 DIAGNOSIS — Q513 Bicornate uterus: Secondary | ICD-10-CM

## 2017-12-27 DIAGNOSIS — R519 Headache, unspecified: Secondary | ICD-10-CM

## 2017-12-27 DIAGNOSIS — Z789 Other specified health status: Secondary | ICD-10-CM

## 2017-12-27 DIAGNOSIS — O3402 Maternal care for unspecified congenital malformation of uterus, second trimester: Secondary | ICD-10-CM

## 2017-12-27 DIAGNOSIS — O26892 Other specified pregnancy related conditions, second trimester: Secondary | ICD-10-CM

## 2017-12-27 MED ORDER — BUTALBITAL-APAP-CAFFEINE 50-325-40 MG PO TABS: 1.0000 | ORAL_TABLET | Freq: Four times a day (QID) | ORAL | 4 refills | Status: DC | PRN

## 2017-12-27 MED ORDER — PRENATAL VITAMINS 0.8 MG PO TABS: 1.0000 | ORAL_TABLET | Freq: Every day | ORAL | 12 refills | Status: DC

## 2017-12-27 NOTE — Progress Notes (Signed)
   PRENATAL VISIT NOTE  Subjective:  Tinea Nobile is a 27 y.o. G2P0010 at [redacted]w[redacted]d being seen today for ongoing prenatal care.  She is currently monitored for the following issues for this low-risk pregnancy and has Supervision of other normal pregnancy, antepartum; Language barrier; and Bicornate uterus complicating pregnancy on their problem list.  Patient reports no complaints.  Contractions: Not present. Vag. Bleeding: None.  Movement: Present. Denies leaking of fluid.   The following portions of the patient's history were reviewed and updated as appropriate: allergies, current medications, past family history, past medical history, past social history, past surgical history and problem list. Problem list updated.  Objective:   Vitals:   12/27/17 0925  BP: 116/62  Pulse: (!) 114  Temp: 98 F (36.7 C)  Weight: 133 lb 8 oz (60.6 kg)    Fetal Status: Fetal Heart Rate (bpm): 135; doppler Fundal Height: 18 cm Movement: Present     General:  Alert, oriented and cooperative. Patient is in no acute distress.  Skin: Skin is warm and dry. No rash noted.   Cardiovascular: Normal heart rate noted  Respiratory: Normal respiratory effort, no problems with respiration noted  Abdomen: Soft, gravid, appropriate for gestational age.  Pain/Pressure: Present     Pelvic: Cervical exam deferred        Extremities: Normal range of motion.  Edema: None  Mental Status:  Normal mood and affect. Normal behavior. Normal judgment and thought content.   Assessment and Plan:  Pregnancy: G2P0010 at [redacted]w[redacted]d  1. Supervision of other normal pregnancy, antepartum      Doing well - Prenatal Multivit-Min-Fe-FA (PRENATAL VITAMINS) 0.8 MG tablet; Take 1 tablet by mouth daily.  Dispense: 30 tablet; Refill: 12  2. 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 Language barrier     Interpreter present for exam  4. Pregnancy headache in second trimester    OTC Tylenol.  Dental exam encouraged.  - butalbital-acetaminophen-caffeine (FIORICET, ESGIC) 50-325-40 MG tablet; Take 1-2 tablets by mouth every 6 (six) hours as needed.  Dispense: 45 tablet; Refill: 4  Preterm labor symptoms and general obstetric precautions including but not limited to vaginal bleeding, contractions, leaking of fluid and fetal movement were reviewed in detail with the patient. Please refer to After Visit Summary for other counseling recommendations.  Return in about 4 weeks (around 01/24/2018) for ROB.   Morene Crocker, CNM

## 2017-12-27 NOTE — Progress Notes (Signed)
Complains of numbness in both legs and arms x 3 weeks. Needs Rx for PNV.  Having headaches 9-10/10 x 45 days. Throats hurts whenever she swallows, chapped lips and runny nose.

## 2017-12-30 ENCOUNTER — Ambulatory Visit (HOSPITAL_COMMUNITY)
Admission: RE | Admit: 2017-12-30 | Discharge: 2017-12-30 | Disposition: A | Discharge status: Home / Self Care | Payer: Self-pay | Source: Ambulatory Visit | Attending: Certified Nurse Midwife | Admitting: Certified Nurse Midwife

## 2017-12-30 ENCOUNTER — Other Ambulatory Visit: Payer: Self-pay | Admitting: Certified Nurse Midwife

## 2017-12-30 DIAGNOSIS — Z3480 Encounter for supervision of other normal pregnancy, unspecified trimester: Secondary | ICD-10-CM

## 2017-12-30 DIAGNOSIS — Z3689 Encounter for other specified antenatal screening: Secondary | ICD-10-CM

## 2017-12-30 DIAGNOSIS — Z3A19 19 weeks gestation of pregnancy: Secondary | ICD-10-CM | POA: Insufficient documentation

## 2017-12-30 DIAGNOSIS — O34592 Maternal care for other abnormalities of gravid uterus, second trimester: Secondary | ICD-10-CM | POA: Insufficient documentation

## 2017-12-30 DIAGNOSIS — Q513 Bicornate uterus: Secondary | ICD-10-CM | POA: Insufficient documentation

## 2017-12-31 ENCOUNTER — Other Ambulatory Visit: Payer: Self-pay | Admitting: Certified Nurse Midwife

## 2017-12-31 DIAGNOSIS — Z3480 Encounter for supervision of other normal pregnancy, unspecified trimester: Secondary | ICD-10-CM

## 2017-12-31 DIAGNOSIS — Q513 Bicornate uterus: Principal | ICD-10-CM

## 2017-12-31 DIAGNOSIS — O3402 Maternal care for unspecified congenital malformation of uterus, second trimester: Secondary | ICD-10-CM

## 2018-01-20 ENCOUNTER — Ambulatory Visit (HOSPITAL_COMMUNITY)
Admission: RE | Admit: 2018-01-20 | Discharge: 2018-01-20 | Disposition: A | Discharge status: Home / Self Care | Payer: Self-pay | Source: Ambulatory Visit | Attending: Certified Nurse Midwife | Admitting: Certified Nurse Midwife

## 2018-01-24 ENCOUNTER — Encounter: Payer: Self-pay | Admitting: Obstetrics

## 2018-01-24 ENCOUNTER — Ambulatory Visit (INDEPENDENT_AMBULATORY_CARE_PROVIDER_SITE_OTHER): Payer: Self-pay | Admitting: Obstetrics

## 2018-01-24 VITALS — BP 103/63 | HR 90 | Wt 140.8 lb

## 2018-01-24 DIAGNOSIS — Z3480 Encounter for supervision of other normal pregnancy, unspecified trimester: Secondary | ICD-10-CM

## 2018-01-24 DIAGNOSIS — O3402 Maternal care for unspecified congenital malformation of uterus, second trimester: Secondary | ICD-10-CM

## 2018-01-24 DIAGNOSIS — Z3482 Encounter for supervision of other normal pregnancy, second trimester: Secondary | ICD-10-CM

## 2018-01-24 DIAGNOSIS — Q513 Bicornate uterus: Secondary | ICD-10-CM

## 2018-01-24 MED ORDER — COMFORT FIT MATERNITY SUPP SM MISC: 0 refills | Status: DC

## 2018-01-24 NOTE — Progress Notes (Signed)
Subjective:  Robyn Harvey is a 27 y.o. G2P0010 at [redacted]w[redacted]d being seen today for ongoing prenatal care.  She is currently monitored for the following issues for this high-risk pregnancy and has Supervision of other normal pregnancy, antepartum; Language barrier; and Bicornate uterus complicating pregnancy on their problem list.  Patient reports occasional RLQ pain.  Contractions: Not present. Vag. Bleeding: None.  Movement: Present. Denies leaking of fluid.   The following portions of the patient's history were reviewed and updated as appropriate: allergies, current medications, past family history, past medical history, past social history, past surgical history and problem list. Problem list updated.  Objective:   Vitals:   01/24/18 1456  BP: 103/63  Pulse: 90  Weight: 140 lb 12.8 oz (63.9 kg)    Fetal Status: Fetal Heart Rate (bpm): 140   Movement: Present     General:  Alert, oriented and cooperative. Patient is in no acute distress.  Skin: Skin is warm and dry. No rash noted.   Cardiovascular: Normal heart rate noted  Respiratory: Normal respiratory effort, no problems with respiration noted  Abdomen: Soft, gravid, appropriate for gestational age. Pain/Pressure: Present     Pelvic:  Cervical exam deferred        Extremities: Normal range of motion.  Edema: None  Mental Status: Normal mood and affect. Normal behavior. Normal judgment and thought content.   Urinalysis:      Assessment and Plan:  Pregnancy: G2P0010 at [redacted]w[redacted]d  1. Supervision of other normal pregnancy, antepartum  2. 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  Dispense: 1 each; Refill: 0 - [place] MFM OB FOLLOW UP; Future  Preterm labor symptoms and general obstetric precautions including but not limited to vaginal bleeding, contractions, leaking of fluid and fetal movement were reviewed in detail with the  patient. Please refer to After Visit Summary for other counseling recommendations.  Return in about 4 weeks (around 02/21/2018) for ROB, 2 hour OGTT.   Shelly Bombard, MD

## 2018-01-24 NOTE — Progress Notes (Signed)
Patient reports good fetal movement with occasional right sided pain.

## 2018-02-10 ENCOUNTER — Other Ambulatory Visit: Payer: Self-pay | Admitting: Obstetrics

## 2018-02-10 ENCOUNTER — Ambulatory Visit (HOSPITAL_COMMUNITY)
Admission: RE | Admit: 2018-02-10 | Discharge: 2018-02-10 | Disposition: A | Discharge status: Home / Self Care | Payer: Self-pay | Source: Ambulatory Visit | Attending: Obstetrics | Admitting: Obstetrics

## 2018-02-10 DIAGNOSIS — Z3A25 25 weeks gestation of pregnancy: Secondary | ICD-10-CM

## 2018-02-10 DIAGNOSIS — Q513 Bicornate uterus: Secondary | ICD-10-CM | POA: Insufficient documentation

## 2018-02-10 DIAGNOSIS — O3402 Maternal care for unspecified congenital malformation of uterus, second trimester: Secondary | ICD-10-CM

## 2018-02-10 DIAGNOSIS — O34592 Maternal care for other abnormalities of gravid uterus, second trimester: Secondary | ICD-10-CM | POA: Insufficient documentation

## 2018-02-21 ENCOUNTER — Ambulatory Visit (INDEPENDENT_AMBULATORY_CARE_PROVIDER_SITE_OTHER): Payer: Self-pay | Admitting: Certified Nurse Midwife

## 2018-02-21 VITALS — BP 112/72 | HR 102 | Wt 147.0 lb

## 2018-02-21 DIAGNOSIS — L299 Pruritus, unspecified: Secondary | ICD-10-CM

## 2018-02-21 DIAGNOSIS — K219 Gastro-esophageal reflux disease without esophagitis: Secondary | ICD-10-CM

## 2018-02-21 DIAGNOSIS — N949 Unspecified condition associated with female genital organs and menstrual cycle: Secondary | ICD-10-CM

## 2018-02-21 DIAGNOSIS — Z3480 Encounter for supervision of other normal pregnancy, unspecified trimester: Secondary | ICD-10-CM

## 2018-02-21 DIAGNOSIS — Z789 Other specified health status: Secondary | ICD-10-CM

## 2018-02-21 DIAGNOSIS — O26892 Other specified pregnancy related conditions, second trimester: Secondary | ICD-10-CM

## 2018-02-21 DIAGNOSIS — O99612 Diseases of the digestive system complicating pregnancy, second trimester: Secondary | ICD-10-CM

## 2018-02-21 DIAGNOSIS — R102 Pelvic and perineal pain: Secondary | ICD-10-CM

## 2018-02-21 MED ORDER — HYDROXYZINE PAMOATE 50 MG PO CAPS: 50.0000 mg | ORAL_CAPSULE | Freq: Three times a day (TID) | ORAL | 2 refills | Status: DC | PRN

## 2018-02-21 MED ORDER — FAMOTIDINE 20 MG PO TABS: 20.0000 mg | ORAL_TABLET | Freq: Two times a day (BID) | ORAL | 6 refills | Status: AC

## 2018-02-21 MED ORDER — COMFORT FIT MATERNITY SUPP LG MISC: 1.0000 [IU] | Freq: Every day | 0 refills | Status: DC

## 2018-02-21 NOTE — Progress Notes (Signed)
PRENATAL VISIT NOTE  Subjective:  Robyn Harvey is a 27 y.o. G2P0010 at 60w3dbeing seen today for ongoing prenatal care.  She is currently monitored for the following issues for this low-risk pregnancy and has Supervision of other normal pregnancy, antepartum; Language barrier; and Bicornate uterus complicating pregnancy on their problem list.  Patient reports no bleeding, no contractions, no cramping, no leaking and groin pressure with movement, itching all over her body that started on the soles of her feet.  Reports increased reflux and abdominal hernia pain.  Did not get maternity support belt.  .  Contractions: Not present. Vag. Bleeding: None.  Movement: Present. Denies leaking of fluid.   The following portions of the patient's history were reviewed and updated as appropriate: allergies, current medications, past family history, past medical history, past social history, past surgical history and problem list. Problem list updated.  Objective:   Vitals:   02/21/18 1506  BP: 112/72  Pulse: (!) 102  Weight: 147 lb (66.7 kg)    Fetal Status: Fetal Heart Rate (bpm): 145; doppler Fundal Height: 27 cm Movement: Present     General:  Alert, oriented and cooperative. Patient is in no acute distress.  Skin: Skin is warm and dry. No rash noted.   Cardiovascular: Normal heart rate noted  Respiratory: Normal respiratory effort, no problems with respiration noted  Abdomen: Soft, gravid, appropriate for gestational age.  Pain/Pressure: Present     Pelvic: Cervical exam deferred        Extremities: Normal range of motion.     Mental Status: Normal mood and affect. Normal behavior. Normal judgment and thought content.   Assessment and Plan:  Pregnancy: G2P0010 at 282w3d1. Supervision of other normal pregnancy, antepartum     Complaints above under HPI  2. Language barrier     Live interpreter present for exam  3. Pelvic pressure in pregnancy, antepartum, second  trimester     Homeopathic remedies discussed.  - Elastic Bandages & Supports (COMFORT FIT MATERNITY SUPP LG) MISC; 1 Units by Does not apply route daily.  Dispense: 1 each; Refill: 0  4. Gastroesophageal reflux during pregnancy in second trimester, antepartum     OTC TUMS - famotidine (PEPCID) 20 MG tablet; Take 1 tablet (20 mg total) by mouth 2 (two) times daily.  Dispense: 60 tablet; Refill: 6  5. Severe itching      R/O cholestasis - hydrOXYzine (VISTARIL) 50 MG capsule; Take 1 capsule (50 mg total) by mouth 3 (three) times daily as needed.  Dispense: 30 capsule; Refill: 2 - CBC - Comp Met (CMET) - Bile acids, total  Preterm labor symptoms and general obstetric precautions including but not limited to vaginal bleeding, contractions, leaking of fluid and fetal movement were reviewed in detail with the patient. Please refer to After Visit Summary for other counseling recommendations.  Return in about 2 weeks (around 03/07/2018) for ROB, 2 hour OGTT ASAP.  No future appointments.  RaMorene CrockerCNM

## 2018-02-23 LAB — COMPREHENSIVE METABOLIC PANEL
ALK PHOS: 89 IU/L (ref 39–117)
ALT: 17 IU/L (ref 0–32)
AST: 25 IU/L (ref 0–40)
Albumin/Globulin Ratio: 1.3 (ref 1.2–2.2)
Albumin: 3.5 g/dL (ref 3.5–5.5)
BUN/Creatinine Ratio: 9 (ref 9–23)
BUN: 5 mg/dL — ABNORMAL LOW (ref 6–20)
Bilirubin Total: 0.2 mg/dL (ref 0.0–1.2)
CALCIUM: 8.9 mg/dL (ref 8.7–10.2)
CO2: 24 mmol/L (ref 20–29)
CREATININE: 0.53 mg/dL — AB (ref 0.57–1.00)
Chloride: 100 mmol/L (ref 96–106)
GFR calc Af Amer: 149 mL/min/{1.73_m2} (ref >59)
GFR, EST NON AFRICAN AMERICAN: 130 mL/min/{1.73_m2} (ref >59)
GLOBULIN, TOTAL: 2.6 g/dL (ref 1.5–4.5)
GLUCOSE: 84 mg/dL (ref 65–99)
Potassium: 3.7 mmol/L (ref 3.5–5.2)
SODIUM: 138 mmol/L (ref 134–144)
Total Protein: 6.1 g/dL (ref 6.0–8.5)

## 2018-02-23 LAB — CBC
HEMATOCRIT: 34.3 % (ref 34.0–46.6)
Hemoglobin: 11.6 g/dL (ref 11.1–15.9)
MCH: 29.9 pg (ref 26.6–33.0)
MCHC: 33.8 g/dL (ref 31.5–35.7)
MCV: 88 fL (ref 79–97)
PLATELETS: 182 10*3/uL (ref 150–379)
RBC: 3.88 x10E6/uL (ref 3.77–5.28)
RDW: 15.1 % (ref 12.3–15.4)
WBC: 7.7 10*3/uL (ref 3.4–10.8)

## 2018-02-23 LAB — BILE ACIDS, TOTAL: BILE ACIDS TOTAL: 29.2 umol/L — AB (ref 4.7–24.5)

## 2018-02-27 ENCOUNTER — Other Ambulatory Visit: Payer: Self-pay | Admitting: Certified Nurse Midwife

## 2018-02-27 DIAGNOSIS — K831 Obstruction of bile duct: Secondary | ICD-10-CM

## 2018-02-27 DIAGNOSIS — O26613 Liver and biliary tract disorders in pregnancy, third trimester: Principal | ICD-10-CM

## 2018-02-27 MED ORDER — URSODIOL 500 MG PO TABS: 500.0000 mg | ORAL_TABLET | Freq: Three times a day (TID) | ORAL | 2 refills | Status: DC

## 2018-03-07 ENCOUNTER — Ambulatory Visit (INDEPENDENT_AMBULATORY_CARE_PROVIDER_SITE_OTHER): Payer: Self-pay | Admitting: Certified Nurse Midwife

## 2018-03-07 ENCOUNTER — Other Ambulatory Visit: Payer: Self-pay

## 2018-03-07 VITALS — BP 102/60 | HR 97 | Wt 150.6 lb

## 2018-03-07 DIAGNOSIS — O26613 Liver and biliary tract disorders in pregnancy, third trimester: Secondary | ICD-10-CM

## 2018-03-07 DIAGNOSIS — Z789 Other specified health status: Secondary | ICD-10-CM

## 2018-03-07 DIAGNOSIS — O0990 Supervision of high risk pregnancy, unspecified, unspecified trimester: Secondary | ICD-10-CM

## 2018-03-07 DIAGNOSIS — K831 Obstruction of bile duct: Secondary | ICD-10-CM

## 2018-03-07 NOTE — Progress Notes (Signed)
   PRENATAL VISIT NOTE  Subjective:  Robyn Harvey is a 27 y.o. G2P0010 at [redacted]w[redacted]d being seen today for ongoing prenatal care.  She is currently monitored for the following issues for this high-risk pregnancy and has Supervision of high risk pregnancy, antepartum; Language barrier; Bicornate uterus complicating pregnancy; and Cholestasis during pregnancy in third trimester on their problem list.  Patient reports no complaints.  Contractions: Irritability. Vag. Bleeding: None.  Movement: Present. Denies leaking of fluid.   The following portions of the patient's history were reviewed and updated as appropriate: allergies, current medications, past family history, past medical history, past social history, past surgical history and problem list. Problem list updated.  Objective:   Vitals:   03/07/18 0925  BP: 102/60  Pulse: 97  Weight: 150 lb 9.6 oz (68.3 kg)    Fetal Status: Fetal Heart Rate (bpm): 147; doppler Fundal Height: 28 cm Movement: Present     General:  Alert, oriented and cooperative. Patient is in no acute distress.  Skin: Skin is warm and dry. No rash noted.   Cardiovascular: Normal heart rate noted  Respiratory: Normal respiratory effort, no problems with respiration noted  Abdomen: Soft, gravid, appropriate for gestational age.  Pain/Pressure: Present     Pelvic: Cervical exam deferred        Extremities: Normal range of motion.  Edema: None  Mental Status: Normal mood and affect. Normal behavior. Normal judgment and thought content.   Assessment and Plan:  Pregnancy: G2P0010 at [redacted]w[redacted]d  1. Supervision of high risk pregnancy, antepartum     Doing well.  Reports less itching with reflux medication.   - Glucose Tolerance, 2 Hours w/1 Hour - CBC - HIV antibody (with reflex) - RPR  2. Language barrier     Live spanish Ms. Turner interpreter present for exam.   3. Cholestasis during pregnancy in third trimester     Dx last week at 28 wks.  Is going to pick  up Actigall today.  Spouse present for exam.  Will have insurance around due date.   Preterm labor symptoms and general obstetric precautions including but not limited to vaginal bleeding, contractions, leaking of fluid and fetal movement were reviewed in detail with the patient. Please refer to After Visit Summary for other counseling recommendations.  Return in about 2 weeks (around 03/21/2018) for [facility]; , Bi-weekly Antenatal testing with weekly AFI starting next HOB.  Future Appointments  Date Time Provider [place]  03/08/2018  2:45 PM [place] 2 WH-MFCUS MFC-US  03/21/2018  9:45 AM Constant, Vickii Chafe, MD [place] None    Morene Crocker, CNM

## 2018-03-07 NOTE — Progress Notes (Signed)
Letter given to go to the [facility] for TDAP.

## 2018-03-08 ENCOUNTER — Other Ambulatory Visit: Payer: Self-pay | Admitting: Certified Nurse Midwife

## 2018-03-08 ENCOUNTER — Encounter (HOSPITAL_COMMUNITY): Payer: Self-pay

## 2018-03-08 ENCOUNTER — Ambulatory Visit (HOSPITAL_COMMUNITY)
Admission: RE | Admit: 2018-03-08 | Discharge: 2018-03-08 | Disposition: A | Discharge status: Home / Self Care | Payer: Self-pay | Source: Ambulatory Visit | Attending: Certified Nurse Midwife | Admitting: Certified Nurse Midwife

## 2018-03-08 DIAGNOSIS — O0990 Supervision of high risk pregnancy, unspecified, unspecified trimester: Secondary | ICD-10-CM

## 2018-03-08 DIAGNOSIS — O26643 Intrahepatic cholestasis of pregnancy, third trimester: Secondary | ICD-10-CM

## 2018-03-08 DIAGNOSIS — O26613 Liver and biliary tract disorders in pregnancy, third trimester: Secondary | ICD-10-CM | POA: Insufficient documentation

## 2018-03-08 DIAGNOSIS — Z3A29 29 weeks gestation of pregnancy: Secondary | ICD-10-CM | POA: Insufficient documentation

## 2018-03-08 DIAGNOSIS — K831 Obstruction of bile duct: Secondary | ICD-10-CM | POA: Insufficient documentation

## 2018-03-08 HISTORY — DX: Bicornate uterus: Q51.3

## 2018-03-08 LAB — GLUCOSE TOLERANCE, 2 HOURS W/ 1HR
GLUCOSE, 1 HOUR: 101 mg/dL (ref 65–179)
GLUCOSE, 2 HOUR: 87 mg/dL (ref 65–152)
Glucose, Fasting: 83 mg/dL (ref 65–91)

## 2018-03-08 LAB — CBC
Hematocrit: 35.6 % (ref 34.0–46.6)
Hemoglobin: 11.6 g/dL (ref 11.1–15.9)
MCH: 29.4 pg (ref 26.6–33.0)
MCHC: 32.6 g/dL (ref 31.5–35.7)
MCV: 90 fL (ref 79–97)
Platelets: 195 10*3/uL (ref 150–379)
RBC: 3.95 x10E6/uL (ref 3.77–5.28)
RDW: 14.9 % (ref 12.3–15.4)
WBC: 6.8 10*3/uL (ref 3.4–10.8)

## 2018-03-08 LAB — HIV ANTIBODY (ROUTINE TESTING W REFLEX): HIV SCREEN 4TH GENERATION: NONREACTIVE

## 2018-03-08 LAB — RPR: RPR: NONREACTIVE

## 2018-03-08 NOTE — ED Notes (Signed)
Robyn Harvey present as interpreter. 

## 2018-03-21 ENCOUNTER — Ambulatory Visit (INDEPENDENT_AMBULATORY_CARE_PROVIDER_SITE_OTHER): Payer: Self-pay | Admitting: Obstetrics and Gynecology

## 2018-03-21 ENCOUNTER — Encounter: Payer: Self-pay | Admitting: Obstetrics and Gynecology

## 2018-03-21 VITALS — BP 112/73 | HR 105 | Wt 158.1 lb

## 2018-03-21 DIAGNOSIS — K831 Obstruction of bile duct: Secondary | ICD-10-CM

## 2018-03-21 DIAGNOSIS — O26643 Intrahepatic cholestasis of pregnancy, third trimester: Secondary | ICD-10-CM

## 2018-03-21 DIAGNOSIS — O26613 Liver and biliary tract disorders in pregnancy, third trimester: Secondary | ICD-10-CM

## 2018-03-21 DIAGNOSIS — Z603 Acculturation difficulty: Secondary | ICD-10-CM

## 2018-03-21 DIAGNOSIS — O0990 Supervision of high risk pregnancy, unspecified, unspecified trimester: Secondary | ICD-10-CM

## 2018-03-21 DIAGNOSIS — Z789 Other specified health status: Secondary | ICD-10-CM

## 2018-03-21 MED ORDER — URSODIOL 500 MG PO TABS: 500.0000 mg | ORAL_TABLET | Freq: Three times a day (TID) | ORAL | 2 refills | Status: DC

## 2018-03-21 NOTE — Progress Notes (Signed)
   PRENATAL VISIT NOTE  Subjective:  Robyn Harvey is a 27 y.o. G2P0010 at [redacted]w[redacted]d being seen today for ongoing prenatal care.  She is currently monitored for the following issues for this high-risk pregnancy and has Supervision of high risk pregnancy, antepartum; Language barrier; Bicornate uterus complicating pregnancy; and Cholestasis during pregnancy in third trimester on their problem list.  Patient reports some pruritis.  Contractions: Not present. Vag. Bleeding: None.  Movement: Present. Denies leaking of fluid.   The following portions of the patient's history were reviewed and updated as appropriate: allergies, current medications, past family history, past medical history, past social history, past surgical history and problem list. Problem list updated.  Objective:   Vitals:   03/21/18 0942  BP: 112/73  Pulse: (!) 105  Weight: 158 lb 1.6 oz (71.7 kg)    Fetal Status: Fetal Heart Rate (bpm): 151 Fundal Height: 31 cm Movement: Present     General:  Alert, oriented and cooperative. Patient is in no acute distress.  Skin: Skin is warm and dry. No rash noted.   Cardiovascular: Normal heart rate noted  Respiratory: Normal respiratory effort, no problems with respiration noted  Abdomen: Soft, gravid, appropriate for gestational age.  Pain/Pressure: Present     Pelvic: Cervical exam deferred        Extremities: Normal range of motion.  Edema: Trace  Mental Status: Normal mood and affect. Normal behavior. Normal judgment and thought content.   Assessment and Plan:  Pregnancy: G2P0010 at [redacted]w[redacted]d  1. Supervision of high risk pregnancy, antepartum Patient is doing well without complaints - [place] MFM OB FOLLOW UP; Future  2. Cholestasis during pregnancy in third trimester Patient has not been able to obtain Actigall due to cost Patient to start antenatal testing next week. Cannot afford BPP now Discussed IOL at 37 weeks Growth ultrasound with health department ordered for  week of 5/20 Patient is in the process of applying for financial assistance - [place] MFM OB FOLLOW UP; Future  3. Language barrier Spanish interpreter present  Preterm labor symptoms and general obstetric precautions including but not limited to vaginal bleeding, contractions, leaking of fluid and fetal movement were reviewed in detail with the patient. Please refer to After Visit Summary for other counseling recommendations.  Return in about 1 week (around 03/28/2018) for ROB, NST, AFI.  No future appointments.  Mora Bellman, MD

## 2018-03-21 NOTE — Addendum Note (Signed)
Addended by: Mora Bellman on: 03/21/2018 10:06 AM   Modules accepted: Orders

## 2018-03-24 ENCOUNTER — Other Ambulatory Visit: Payer: Self-pay

## 2018-03-28 ENCOUNTER — Encounter: Payer: Self-pay | Admitting: Obstetrics and Gynecology

## 2018-03-28 ENCOUNTER — Other Ambulatory Visit: Payer: Self-pay

## 2018-03-28 ENCOUNTER — Ambulatory Visit (INDEPENDENT_AMBULATORY_CARE_PROVIDER_SITE_OTHER): Payer: Medicaid Other | Admitting: Obstetrics and Gynecology

## 2018-03-28 VITALS — BP 118/72 | HR 109 | Wt 158.4 lb

## 2018-03-28 DIAGNOSIS — Z789 Other specified health status: Secondary | ICD-10-CM

## 2018-03-28 DIAGNOSIS — O26643 Intrahepatic cholestasis of pregnancy, third trimester: Secondary | ICD-10-CM

## 2018-03-28 DIAGNOSIS — O0993 Supervision of high risk pregnancy, unspecified, third trimester: Secondary | ICD-10-CM

## 2018-03-28 DIAGNOSIS — K831 Obstruction of bile duct: Secondary | ICD-10-CM

## 2018-03-28 DIAGNOSIS — O26613 Liver and biliary tract disorders in pregnancy, third trimester: Principal | ICD-10-CM

## 2018-03-28 DIAGNOSIS — O0990 Supervision of high risk pregnancy, unspecified, unspecified trimester: Secondary | ICD-10-CM

## 2018-03-28 NOTE — Progress Notes (Signed)
   PRENATAL VISIT NOTE  Subjective:  Robyn Harvey is a 27 y.o. G2P0010 at [redacted]w[redacted]d being seen today for ongoing prenatal care.  She is currently monitored for the following issues for this high-risk pregnancy and has Supervision of high risk pregnancy, antepartum; Language barrier; Bicornate uterus complicating pregnancy; and Cholestasis during pregnancy in third trimester on their problem list.  Patient reports no complaints.  Contractions: Not present. Vag. Bleeding: None.  Movement: Present. Denies leaking of fluid.   The following portions of the patient's history were reviewed and updated as appropriate: allergies, current medications, past family history, past medical history, past social history, past surgical history and problem list. Problem list updated.  Objective:   Vitals:   03/28/18 0906  BP: 118/72  Pulse: (!) 109  Weight: 158 lb 6.4 oz (71.8 kg)    Fetal Status: Fetal Heart Rate (bpm): NST Fundal Height: 32 cm Movement: Present     General:  Alert, oriented and cooperative. Patient is in no acute distress.  Skin: Skin is warm and dry. No rash noted.   Cardiovascular: Normal heart rate noted  Respiratory: Normal respiratory effort, no problems with respiration noted  Abdomen: Soft, gravid, appropriate for gestational age.  Pain/Pressure: Present     Pelvic: Cervical exam deferred        Extremities: Normal range of motion.  Edema: Trace  Mental Status: Normal mood and affect. Normal behavior. Normal judgment and thought content.   Assessment and Plan:  Pregnancy: G2P0010 at [redacted]w[redacted]d  1. Supervision of high risk pregnancy, antepartum Patient is doing well without complaints  2. Cholestasis during pregnancy in third trimester Informed patient that braxton hicks contractions are expected at this stage of the pregnancy and therefore not caused by Actigall. Advised patient to take full dose as prescribed and to stay well hydrated Follow up growth ultrasound on  5/20 NST reviewed and reactive with baseline 130, mod variability, +accels, no decels  3. Language barrier Spanish interpreter present  Preterm labor symptoms and general obstetric precautions including but not limited to vaginal bleeding, contractions, leaking of fluid and fetal movement were reviewed in detail with the patient. Please refer to After Visit Summary for other counseling recommendations.  Return in about 1 week (around 04/04/2018) for ROB, NST.  Future Appointments  Date Time Provider [place]  03/28/2018  9:30 AM [place] None  03/31/2018  9:00 AM [facility] NURSE [facility] None  04/03/2018  8:45 AM [facility] [place] 2 [facility]    Mora Bellman, MD

## 2018-03-28 NOTE — Addendum Note (Signed)
Addended by: Maryruth Eve on: 03/28/2018 10:14 AM   Modules accepted: Orders

## 2018-03-28 NOTE — Progress Notes (Signed)
Patient reports fetal movement, and states that she has been cutting ursodiol pill in half because it makes her feel light-headed, and causes her to have a lot of cramping when taking it whole.

## 2018-03-31 ENCOUNTER — Ambulatory Visit (INDEPENDENT_AMBULATORY_CARE_PROVIDER_SITE_OTHER): Payer: Medicaid Other

## 2018-03-31 DIAGNOSIS — K831 Obstruction of bile duct: Secondary | ICD-10-CM | POA: Diagnosis not present

## 2018-03-31 DIAGNOSIS — O26613 Liver and biliary tract disorders in pregnancy, third trimester: Secondary | ICD-10-CM

## 2018-03-31 NOTE — Progress Notes (Addendum)
Chart reviewed for nurse visit. Agree with plan of care.   NST reveiwed.  Baseline: 150 Variability: moderate Accels: 15x15 Decels: none  Toco: none   Wende Mott, [place] 03/31/2018 11:32 AM

## 2018-03-31 NOTE — Progress Notes (Signed)
NST dx: Cholestasis. NST-R reviewed with provider.

## 2018-04-03 ENCOUNTER — Ambulatory Visit (HOSPITAL_COMMUNITY)
Admission: RE | Admit: 2018-04-03 | Discharge: 2018-04-03 | Disposition: A | Discharge status: Home / Self Care | Payer: Self-pay | Source: Ambulatory Visit | Attending: Obstetrics and Gynecology | Admitting: Obstetrics and Gynecology

## 2018-04-03 DIAGNOSIS — K831 Obstruction of bile duct: Secondary | ICD-10-CM | POA: Insufficient documentation

## 2018-04-03 DIAGNOSIS — O321XX0 Maternal care for breech presentation, not applicable or unspecified: Secondary | ICD-10-CM | POA: Insufficient documentation

## 2018-04-03 DIAGNOSIS — Z3A33 33 weeks gestation of pregnancy: Secondary | ICD-10-CM | POA: Insufficient documentation

## 2018-04-03 DIAGNOSIS — O3403 Maternal care for unspecified congenital malformation of uterus, third trimester: Secondary | ICD-10-CM | POA: Insufficient documentation

## 2018-04-03 DIAGNOSIS — O26613 Liver and biliary tract disorders in pregnancy, third trimester: Secondary | ICD-10-CM | POA: Insufficient documentation

## 2018-04-03 DIAGNOSIS — O0990 Supervision of high risk pregnancy, unspecified, unspecified trimester: Secondary | ICD-10-CM

## 2018-04-03 DIAGNOSIS — O26643 Intrahepatic cholestasis of pregnancy, third trimester: Secondary | ICD-10-CM

## 2018-04-03 DIAGNOSIS — Q513 Bicornate uterus: Secondary | ICD-10-CM | POA: Insufficient documentation

## 2018-04-06 ENCOUNTER — Ambulatory Visit (INDEPENDENT_AMBULATORY_CARE_PROVIDER_SITE_OTHER): Payer: Medicaid Other | Admitting: Obstetrics and Gynecology

## 2018-04-06 ENCOUNTER — Encounter: Payer: Self-pay | Admitting: Obstetrics and Gynecology

## 2018-04-06 VITALS — BP 126/73 | HR 105 | Wt 160.2 lb

## 2018-04-06 DIAGNOSIS — K831 Obstruction of bile duct: Secondary | ICD-10-CM | POA: Diagnosis not present

## 2018-04-06 DIAGNOSIS — O3403 Maternal care for unspecified congenital malformation of uterus, third trimester: Secondary | ICD-10-CM

## 2018-04-06 DIAGNOSIS — O26613 Liver and biliary tract disorders in pregnancy, third trimester: Secondary | ICD-10-CM | POA: Diagnosis not present

## 2018-04-06 DIAGNOSIS — O0993 Supervision of high risk pregnancy, unspecified, third trimester: Secondary | ICD-10-CM

## 2018-04-06 DIAGNOSIS — O0990 Supervision of high risk pregnancy, unspecified, unspecified trimester: Secondary | ICD-10-CM

## 2018-04-06 DIAGNOSIS — Q513 Bicornate uterus: Secondary | ICD-10-CM

## 2018-04-06 DIAGNOSIS — Z789 Other specified health status: Secondary | ICD-10-CM

## 2018-04-06 NOTE — Patient Instructions (Signed)

## 2018-04-06 NOTE — Progress Notes (Signed)
NST DX Cholestasis

## 2018-04-06 NOTE — Progress Notes (Signed)
Subjective:  Robyn Harvey is a 27 y.o. G2P0010 at [redacted]w[redacted]d being seen today for ongoing prenatal care.  She is currently monitored for the following issues for this high-risk pregnancy and has Supervision of high risk pregnancy, antepartum; Language barrier; Bicornate uterus complicating pregnancy; and Cholestasis during pregnancy in third trimester on their problem list.  Patient reports general discomforts of pregnancy.  Contractions: Irritability. Vag. Bleeding: None.  Movement: Present. Denies leaking of fluid.   The following portions of the patient's history were reviewed and updated as appropriate: allergies, current medications, past family history, past medical history, past social history, past surgical history and problem list. Problem list updated.  Objective:   Vitals:   04/06/18 1120  BP: 126/73  Pulse: (!) 105  Weight: 160 lb 3.2 oz (72.7 kg)    Fetal Status: Fetal Heart Rate (bpm): NST   Movement: Present     General:  Alert, oriented and cooperative. Patient is in no acute distress.  Skin: Skin is warm and dry. No rash noted.   Cardiovascular: Normal heart rate noted  Respiratory: Normal respiratory effort, no problems with respiration noted  Abdomen: Soft, gravid, appropriate for gestational age. Pain/Pressure: Present     Pelvic:  Cervical exam deferred        Extremities: Normal range of motion.  Edema: Trace  Mental Status: Normal mood and affect. Normal behavior. Normal judgment and thought content.   Urinalysis:      Assessment and Plan:  Pregnancy: G2P0010 at [redacted]w[redacted]d  1. Cholestasis during pregnancy in third trimester Stable - Fetal nonstress test, reactive Continue with antenatal testing  2. Supervision of high risk pregnancy, antepartum Stable Remains breech  3. Language barrier Services used today  4. 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 Please refer to After Visit Summary for other counseling recommendations.  Return in about 1 week (around 04/13/2018) for OB visit.   Chancy Milroy, MD

## 2018-04-11 ENCOUNTER — Ambulatory Visit: Payer: Self-pay

## 2018-04-11 VITALS — BP 116/74 | HR 98 | Wt 161.2 lb

## 2018-04-11 DIAGNOSIS — O0990 Supervision of high risk pregnancy, unspecified, unspecified trimester: Secondary | ICD-10-CM

## 2018-04-11 NOTE — Progress Notes (Signed)
Patient is in the office for NST only. 

## 2018-04-13 ENCOUNTER — Other Ambulatory Visit: Payer: Self-pay

## 2018-04-13 ENCOUNTER — Ambulatory Visit (INDEPENDENT_AMBULATORY_CARE_PROVIDER_SITE_OTHER): Payer: Self-pay | Admitting: Obstetrics and Gynecology

## 2018-04-13 ENCOUNTER — Encounter: Payer: Self-pay | Admitting: Obstetrics and Gynecology

## 2018-04-13 VITALS — BP 109/73 | HR 105 | Wt 162.0 lb

## 2018-04-13 DIAGNOSIS — Q513 Bicornate uterus: Secondary | ICD-10-CM

## 2018-04-13 DIAGNOSIS — Z789 Other specified health status: Secondary | ICD-10-CM

## 2018-04-13 DIAGNOSIS — O329XX0 Maternal care for malpresentation of fetus, unspecified, not applicable or unspecified: Secondary | ICD-10-CM | POA: Insufficient documentation

## 2018-04-13 DIAGNOSIS — K831 Obstruction of bile duct: Secondary | ICD-10-CM

## 2018-04-13 DIAGNOSIS — O3403 Maternal care for unspecified congenital malformation of uterus, third trimester: Secondary | ICD-10-CM

## 2018-04-13 DIAGNOSIS — O26613 Liver and biliary tract disorders in pregnancy, third trimester: Secondary | ICD-10-CM

## 2018-04-13 DIAGNOSIS — O0990 Supervision of high risk pregnancy, unspecified, unspecified trimester: Secondary | ICD-10-CM

## 2018-04-13 NOTE — Progress Notes (Addendum)
Subjective:  Robyn Harvey is a 27 y.o. G2P0010 at [redacted]w[redacted]d being seen today for ongoing prenatal care.  She is currently monitored for the following issues for this high-risk pregnancy and has Supervision of high risk pregnancy, antepartum; Language barrier; Bicornate uterus complicating pregnancy; Cholestasis during pregnancy in third trimester; and Malpresentation of fetus, antepartum on their problem list.  Patient reports no complaints.  Contractions: Irritability. Vag. Bleeding: None.  Movement: Present. Denies leaking of fluid.   The following portions of the patient's history were reviewed and updated as appropriate: allergies, current medications, past family history, past medical history, past social history, past surgical history and problem list. Problem list updated.  Objective:   Vitals:   04/13/18 1339  BP: 109/73  Pulse: (!) 105  Weight: 162 lb (73.5 kg)    Fetal Status: Fetal Heart Rate (bpm): NST   Movement: Present     General:  Alert, oriented and cooperative. Patient is in no acute distress.  Skin: Skin is warm and dry. No rash noted.   Cardiovascular: Normal heart rate noted  Respiratory: Normal respiratory effort, no problems with respiration noted  Abdomen: Soft, gravid, appropriate for gestational age. Pain/Pressure: Present     Pelvic:  Cervical exam deferred        Extremities: Normal range of motion.  Edema: Trace  Mental Status: Normal mood and affect. Normal behavior. Normal judgment and thought content.   Urinalysis:      Assessment and Plan:  Pregnancy: G2P0010 at [redacted]w[redacted]d  1. Supervision of high risk pregnancy, antepartum Stable  2. Cholestasis during pregnancy in third trimester Stable RNST today Continue with antenatal testing IOL at 37 weeks  3. 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 Language barrier Interrupter services used today    Preterm labor symptoms and general obstetric precautions including  but not limited to vaginal bleeding, contractions, leaking of fluid and fetal movement were reviewed in detail with the patient. Please refer to After Visit Summary for other counseling recommendations.  Return in about 1 week (around 04/20/2018) for OB visit.   Chancy Milroy, MD

## 2018-04-13 NOTE — Progress Notes (Signed)
Pt c/o: upper abdomen discomfort and noticed a "pinch " sensation.  NST Start time: 1:45 pm

## 2018-04-13 NOTE — Addendum Note (Signed)
Addended by: Maryruth Eve on: 04/13/2018 02:45 PM   Modules accepted: Orders

## 2018-04-17 ENCOUNTER — Ambulatory Visit (INDEPENDENT_AMBULATORY_CARE_PROVIDER_SITE_OTHER): Payer: Medicaid Other

## 2018-04-17 DIAGNOSIS — O26613 Liver and biliary tract disorders in pregnancy, third trimester: Secondary | ICD-10-CM | POA: Diagnosis not present

## 2018-04-17 DIAGNOSIS — K831 Obstruction of bile duct: Secondary | ICD-10-CM

## 2018-04-17 NOTE — Progress Notes (Signed)
Patient with cholestasis of pregnancy here for NST NST reviewed and reactive with baseline 135, mod variability, +accels, no decels

## 2018-04-17 NOTE — Progress Notes (Signed)
NST DX Cholestasis

## 2018-04-19 IMAGING — US US OB COMP LESS 14 WK
1 series · 15 of 28 positions shown · non-contrast
Comparison: None.

CLINICAL DATA: Pelvic pain and vaginal bleeding beginning today.
Gestational age by LMP of 5 weeks 5 days.

EXAM:
OBSTETRIC <14 WK US AND TRANSVAGINAL OB US
TECHNIQUE: Both transabdominal and transvaginal ultrasound examinations were
performed for complete evaluation of the gestation as well as the
maternal uterus, adnexal regions, and pelvic cul-de-sac.
Transvaginal technique was performed to assess early pregnancy.

[Series 1: us ob comp less 14 wk · 65 acquisitions, 15 frames shown]
[im 1/65]
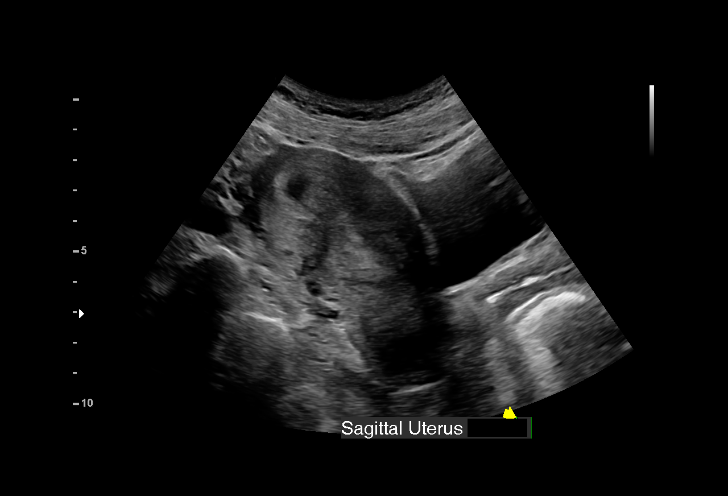
[im 5/65]
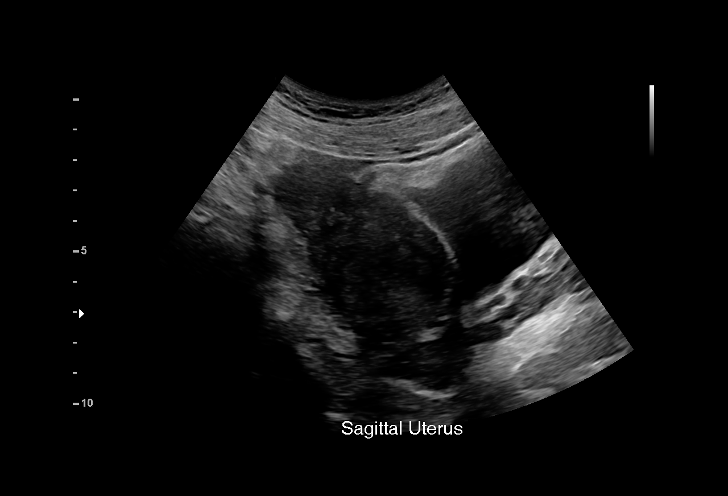
[im 10/65]
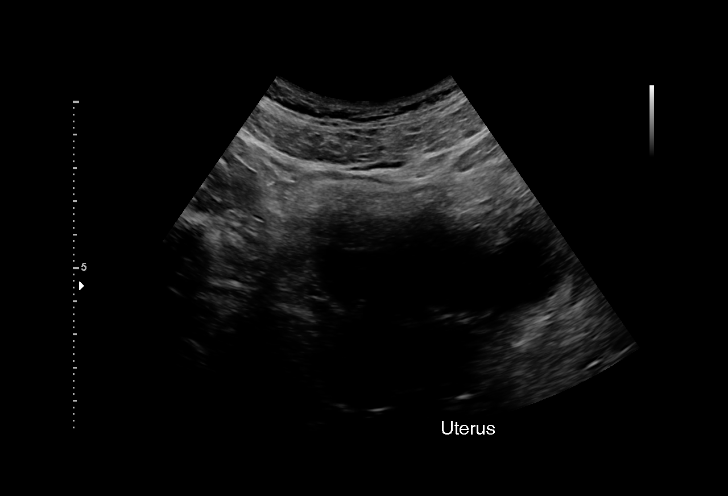
[im 15/65]
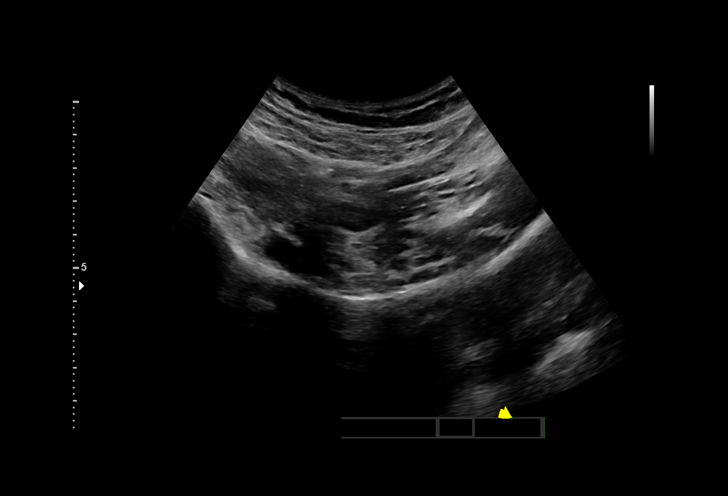
[im 19/65]
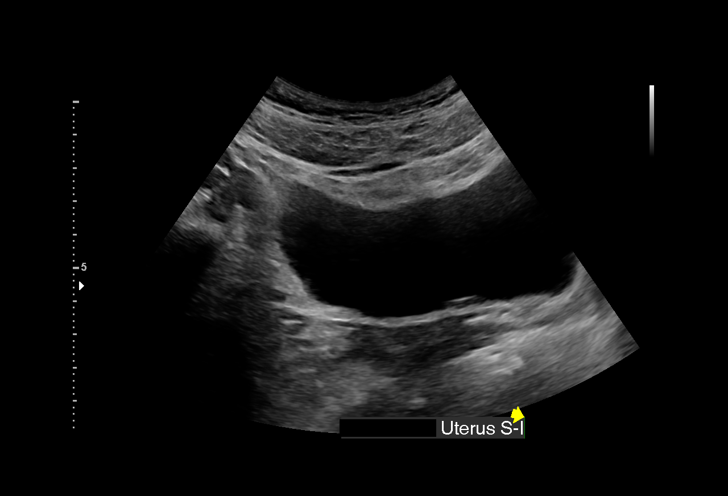
[im 24/65]
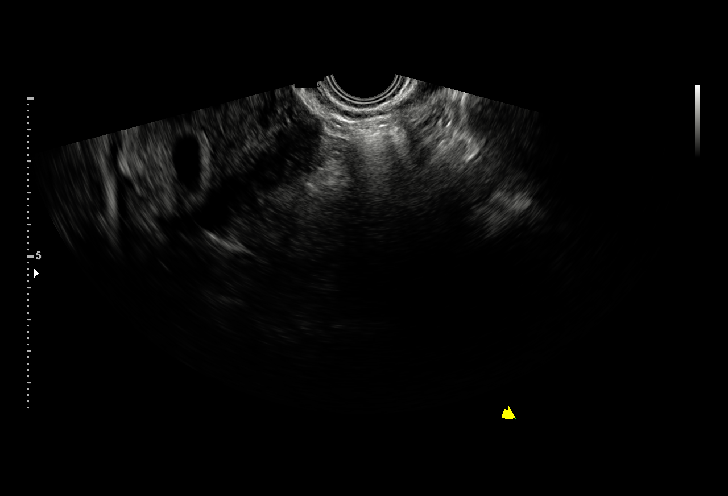
[im 29/65]
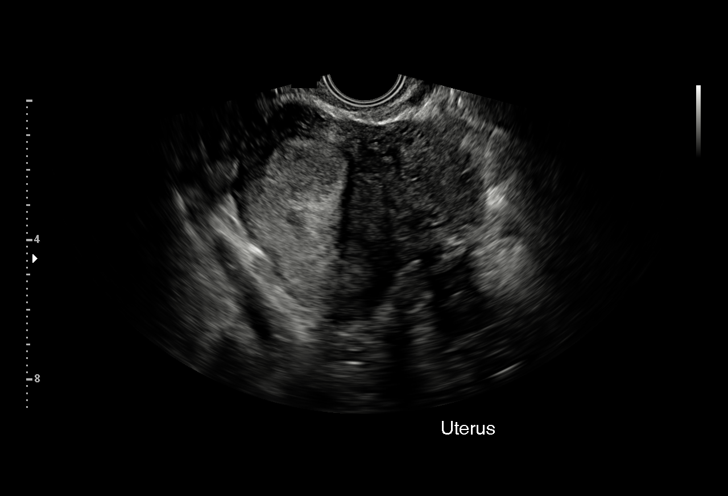
[im 34/65]
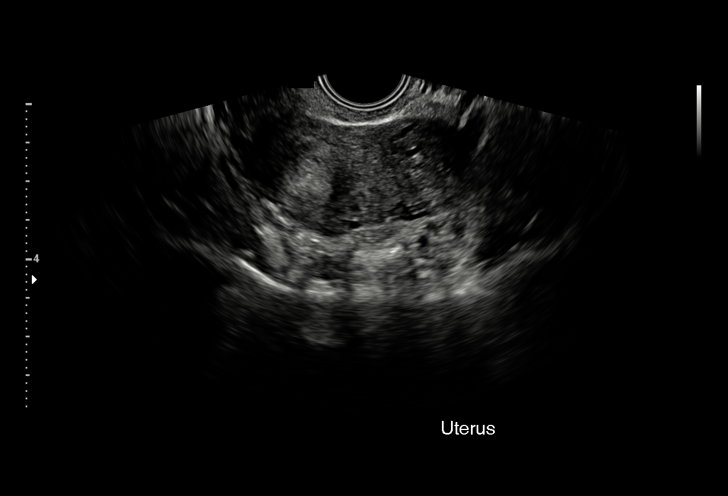
[im 36/65]
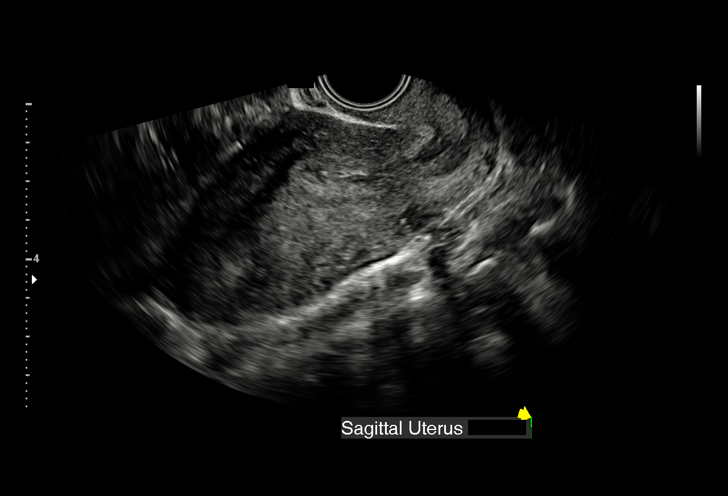
[im 41/65]
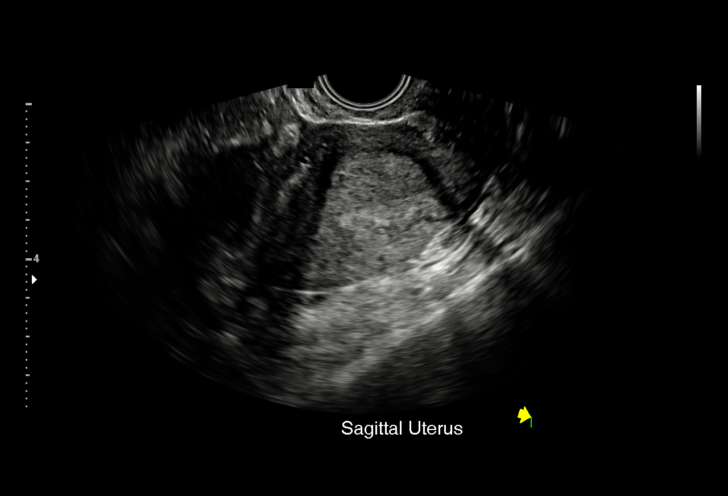
[im 46/65]
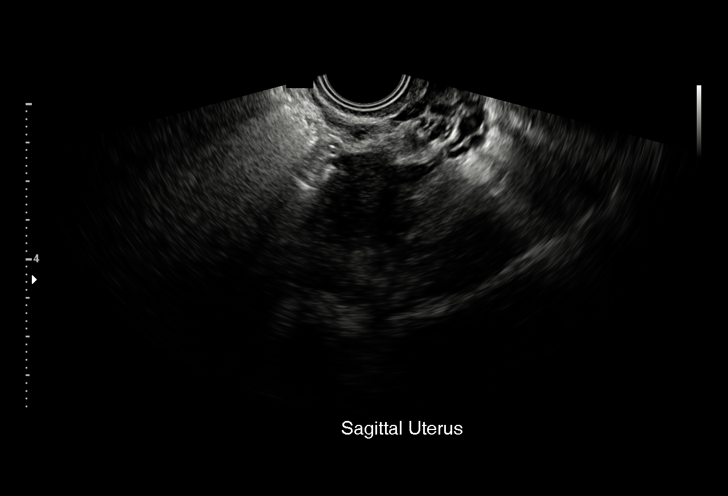
[im 50/65]
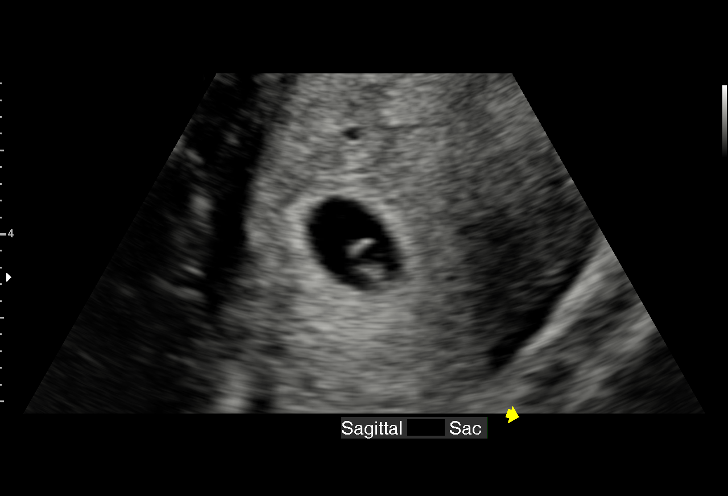
[im 55/65]
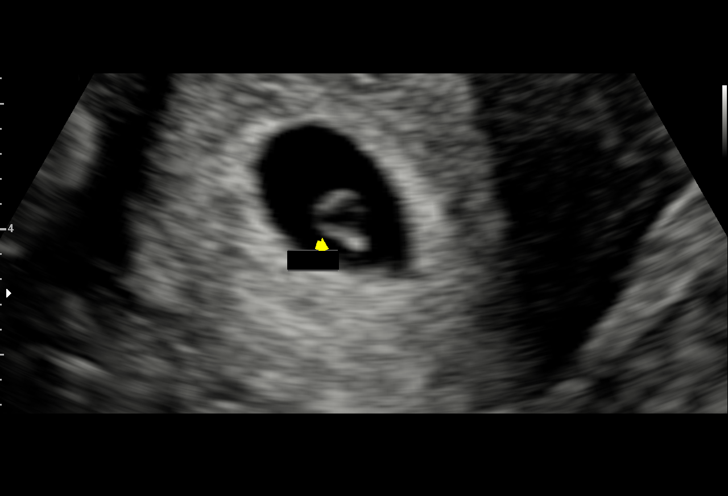
[im 60/65]
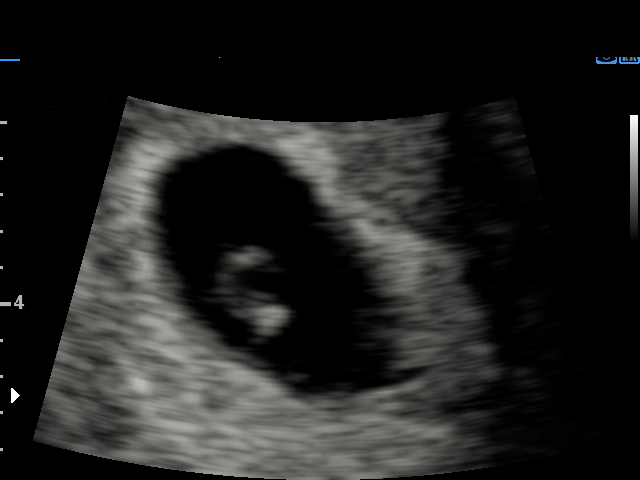
[im 65/65]
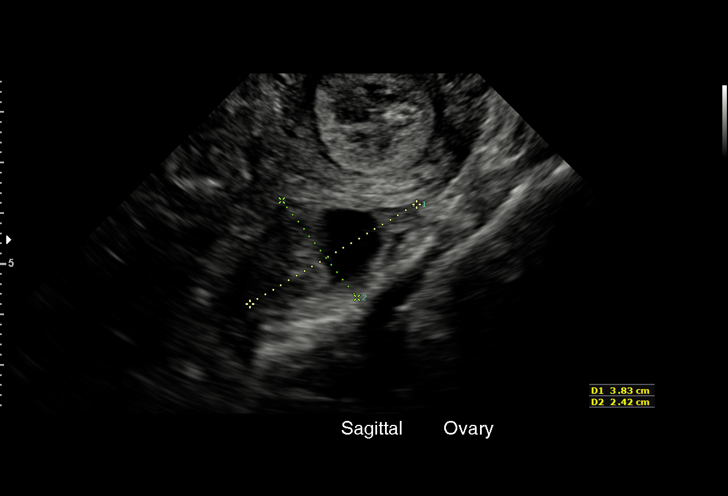

[15 of 28 positions shown; findings below may reference images not displayed]

FINDINGS: Intrauterine gestational sac: Single

Yolk sac:  Visualized.

Embryo:  Visualized.

Cardiac Activity: Visualized.

Heart Rate: 107  bpm

CRL:  5  mm   6 w   1 d                  US EDC: 05/18/2018

Subchorionic hemorrhage:  None visualized.

Maternal uterus/adnexae: Myometrial septum is seen in the fundal
region, with IUP in the right uterine horn. This may represent a
septate or bicornuate uterus. A small fibroid is seen in the right
anterior lower uterine segment which 1.6 cm in maximum diameter.

The left ovary is normal in appearance. Right ovary not directly
visualized, however no adnexal mass or abnormal free fluid
identified.
IMPRESSION: Single living IUP with estimated gestational age of 6 weeks 1 day.
This is concordant with LMP.

1.6 cm fibroid in right anterior lower uterine segment.

Septate versus bicornuate uterus.

## 2018-04-20 ENCOUNTER — Ambulatory Visit (INDEPENDENT_AMBULATORY_CARE_PROVIDER_SITE_OTHER): Payer: Medicaid Other | Admitting: Obstetrics and Gynecology

## 2018-04-20 ENCOUNTER — Encounter: Payer: Self-pay | Admitting: Obstetrics and Gynecology

## 2018-04-20 ENCOUNTER — Other Ambulatory Visit (HOSPITAL_COMMUNITY)
Admission: RE | Admit: 2018-04-20 | Discharge: 2018-04-20 | Disposition: A | Discharge status: Home / Self Care | Payer: Medicaid Other | Source: Ambulatory Visit | Attending: Obstetrics and Gynecology | Admitting: Obstetrics and Gynecology

## 2018-04-20 ENCOUNTER — Other Ambulatory Visit: Payer: Self-pay

## 2018-04-20 VITALS — BP 133/80 | HR 100 | Wt 165.2 lb

## 2018-04-20 DIAGNOSIS — O0990 Supervision of high risk pregnancy, unspecified, unspecified trimester: Secondary | ICD-10-CM | POA: Diagnosis present

## 2018-04-20 DIAGNOSIS — Q513 Bicornate uterus: Secondary | ICD-10-CM

## 2018-04-20 DIAGNOSIS — O26613 Liver and biliary tract disorders in pregnancy, third trimester: Secondary | ICD-10-CM

## 2018-04-20 DIAGNOSIS — Z789 Other specified health status: Secondary | ICD-10-CM

## 2018-04-20 DIAGNOSIS — O0993 Supervision of high risk pregnancy, unspecified, third trimester: Secondary | ICD-10-CM

## 2018-04-20 DIAGNOSIS — Z3A00 Weeks of gestation of pregnancy not specified: Secondary | ICD-10-CM | POA: Insufficient documentation

## 2018-04-20 DIAGNOSIS — O3403 Maternal care for unspecified congenital malformation of uterus, third trimester: Secondary | ICD-10-CM

## 2018-04-20 DIAGNOSIS — K831 Obstruction of bile duct: Secondary | ICD-10-CM

## 2018-04-20 NOTE — Progress Notes (Signed)
Patient reports good fetal movement with occasional sharp pains when changing positions.

## 2018-04-20 NOTE — Progress Notes (Signed)
   PRENATAL VISIT NOTE  Subjective:  Robyn Harvey is a 27 y.o. G2P0010 at [redacted]w[redacted]d being seen today for ongoing prenatal care.  She is currently monitored for the following issues for this high-risk pregnancy and has Supervision of high risk pregnancy, antepartum; Language barrier; Bicornate uterus complicating pregnancy; and Cholestasis during pregnancy in third trimester on their problem list.  Patient reports occasional contractions.  Contractions: Not present. Vag. Bleeding: None.  Movement: Present. Denies leaking of fluid.   The following portions of the patient's history were reviewed and updated as appropriate: allergies, current medications, past family history, past medical history, past social history, past surgical history and problem list. Problem list updated.  Objective:   Vitals:   04/20/18 1417  BP: 133/80  Pulse: 100  Weight: 165 lb 3.2 oz (74.9 kg)    Fetal Status: Fetal Heart Rate (bpm): NST   Movement: Present     General:  Alert, oriented and cooperative. Patient is in no acute distress.  Skin: Skin is warm and dry. No rash noted.   Cardiovascular: Normal heart rate noted  Respiratory: Normal respiratory effort, no problems with respiration noted  Abdomen: Soft, gravid, appropriate for gestational age.  Pain/Pressure: Present     Pelvic: Cervical exam deferred        Extremities: Normal range of motion.  Edema: Trace  Mental Status: Normal mood and affect. Normal behavior. Normal judgment and thought content.   Assessment and Plan:  Pregnancy: G2P0010 at [redacted]w[redacted]d  1. Supervision of high risk pregnancy, antepartum GBS today GC/CT today  2. 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 Language barrier Patent attorney used  4. Cholestasis during pregnancy in third trimester IOL scheduled for 37 weeks Reviewed induction with patient today, answered all questions  5. Malpresentation of fetus  Fetus is transverse today, previously  cephalic and breech Counseled patient regarding ECV, reviewed risks/benefits, she would like to try for ECV Will plan for ECV at 37 weeks and subsequent induction, she is aware she will need CS if unsuccessful  Preterm labor symptoms and general obstetric precautions including but not limited to vaginal bleeding, contractions, leaking of fluid and fetal movement were reviewed in detail with the patient. Please refer to After Visit Summary for other counseling recommendations.  Return in about 1 week (around 04/27/2018) for OB visit (MD).  Future Appointments  Date Time Provider [place]  04/24/2018  9:00 AM [place] None  04/27/2018  1:30 PM [place] None  04/27/2018  2:15 PM Sloan Leiter, MD [place] None  04/29/2018 12:00 AM [facility] None    Sloan Leiter, MD

## 2018-04-21 LAB — OB RESULTS CONSOLE GC/CHLAMYDIA: Gonorrhea: NEGATIVE

## 2018-04-21 LAB — CERVICOVAGINAL ANCILLARY ONLY
CHLAMYDIA, DNA PROBE: NEGATIVE
NEISSERIA GONORRHEA: NEGATIVE

## 2018-04-21 LAB — OB RESULTS CONSOLE GBS: GBS: NEGATIVE

## 2018-04-21 NOTE — Addendum Note (Signed)
Addended by: Vivien Rota on: 04/21/2018 08:44 AM   Modules accepted: Orders, SmartSet

## 2018-04-23 LAB — STREP GP B NAA: STREP GROUP B AG: NEGATIVE

## 2018-04-24 ENCOUNTER — Ambulatory Visit (INDEPENDENT_AMBULATORY_CARE_PROVIDER_SITE_OTHER): Payer: Medicaid Other | Admitting: Obstetrics & Gynecology

## 2018-04-24 ENCOUNTER — Telehealth (HOSPITAL_COMMUNITY): Payer: Self-pay | Admitting: *Deleted

## 2018-04-24 ENCOUNTER — Encounter (HOSPITAL_COMMUNITY): Payer: Self-pay | Admitting: *Deleted

## 2018-04-24 DIAGNOSIS — O26613 Liver and biliary tract disorders in pregnancy, third trimester: Secondary | ICD-10-CM

## 2018-04-24 DIAGNOSIS — K831 Obstruction of bile duct: Secondary | ICD-10-CM | POA: Diagnosis not present

## 2018-04-24 DIAGNOSIS — Z3689 Encounter for other specified antenatal screening: Secondary | ICD-10-CM

## 2018-04-24 NOTE — Progress Notes (Signed)
Pt is here for biweekly NST

## 2018-04-24 NOTE — Telephone Encounter (Signed)
Preadmission screen [phone] interpreter number

## 2018-04-27 ENCOUNTER — Other Ambulatory Visit: Payer: Self-pay

## 2018-04-27 ENCOUNTER — Ambulatory Visit (INDEPENDENT_AMBULATORY_CARE_PROVIDER_SITE_OTHER): Payer: Medicaid Other | Admitting: Obstetrics and Gynecology

## 2018-04-27 ENCOUNTER — Encounter: Payer: Self-pay | Admitting: Obstetrics and Gynecology

## 2018-04-27 VITALS — BP 131/85 | HR 76 | Wt 167.0 lb

## 2018-04-27 DIAGNOSIS — O26613 Liver and biliary tract disorders in pregnancy, third trimester: Secondary | ICD-10-CM

## 2018-04-27 DIAGNOSIS — O0990 Supervision of high risk pregnancy, unspecified, unspecified trimester: Secondary | ICD-10-CM

## 2018-04-27 DIAGNOSIS — K831 Obstruction of bile duct: Secondary | ICD-10-CM

## 2018-04-27 DIAGNOSIS — O26643 Intrahepatic cholestasis of pregnancy, third trimester: Secondary | ICD-10-CM

## 2018-04-27 DIAGNOSIS — O0993 Supervision of high risk pregnancy, unspecified, third trimester: Secondary | ICD-10-CM | POA: Diagnosis not present

## 2018-04-27 DIAGNOSIS — Q513 Bicornate uterus: Secondary | ICD-10-CM

## 2018-04-27 DIAGNOSIS — Z789 Other specified health status: Secondary | ICD-10-CM

## 2018-04-27 DIAGNOSIS — Z603 Acculturation difficulty: Secondary | ICD-10-CM

## 2018-04-27 DIAGNOSIS — O3403 Maternal care for unspecified congenital malformation of uterus, third trimester: Secondary | ICD-10-CM

## 2018-04-27 NOTE — Progress Notes (Signed)
   PRENATAL VISIT NOTE  Subjective:  Robyn Harvey is a 27 y.o. G2P0010 at [redacted]w[redacted]d being seen today for ongoing prenatal care.  She is currently monitored for the following issues for this high-risk pregnancy and has Supervision of high risk pregnancy, antepartum; Language barrier; Bicornate uterus complicating pregnancy; and Cholestasis during pregnancy in third trimester on their problem list.  Patient reports itching on palms.  Contractions: Irregular. Vag. Bleeding: None.  Movement: Present. Denies leaking of fluid.   The following portions of the patient's history were reviewed and updated as appropriate: allergies, current medications, past family history, past medical history, past social history, past surgical history and problem list. Problem list updated.  Objective:   Vitals:   04/27/18 1351  BP: 131/85  Pulse: 76  Weight: 167 lb (75.8 kg)    Fetal Status: Fetal Heart Rate (bpm): NST/AFI   Movement: Present     General:  Alert, oriented and cooperative. Patient is in no acute distress.  Skin: Skin is warm and dry. No rash noted.   Cardiovascular: Normal heart rate noted  Respiratory: Normal respiratory effort, no problems with respiration noted  Abdomen: Soft, gravid, appropriate for gestational age.  Pain/Pressure: Present     Pelvic: Cervical exam deferred        Extremities: Normal range of motion.  Edema: Mild pitting, slight indentation  Mental Status: Normal mood and affect. Normal behavior. Normal judgment and thought content.   Assessment and Plan:  Pregnancy: G2P0010 at [redacted]w[redacted]d  1. Supervision of high risk pregnancy, antepartum  2. Language barrier Spanish interpretor used  3. Cholestasis during pregnancy in third trimester For IOL at 37 weeks NST reactive today AFI 9.9 cm  4. 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 Please refer to After Visit Summary for other counseling recommendations.  Return in about 5 weeks (around 06/01/2018) for post partum check.  Future Appointments  Date Time Provider [place]  04/29/2018  8:00 AM [facility] None    Sloan Leiter, MD

## 2018-04-29 ENCOUNTER — Encounter (HOSPITAL_COMMUNITY): Payer: Self-pay

## 2018-04-29 ENCOUNTER — Inpatient Hospital Stay (HOSPITAL_COMMUNITY)
Admission: RE | Admit: 2018-04-29 | Discharge: 2018-05-02 | DRG: 805 | Disposition: A | Discharge status: Home / Self Care | Payer: Medicaid Other | Source: Ambulatory Visit | Attending: Obstetrics and Gynecology | Admitting: Obstetrics and Gynecology

## 2018-04-29 ENCOUNTER — Inpatient Hospital Stay (HOSPITAL_COMMUNITY): Admission: RE | Admit: 2018-04-29 | Payer: Self-pay | Source: Ambulatory Visit

## 2018-04-29 DIAGNOSIS — O2662 Liver and biliary tract disorders in childbirth: Secondary | ICD-10-CM | POA: Diagnosis present

## 2018-04-29 DIAGNOSIS — O3403 Maternal care for unspecified congenital malformation of uterus, third trimester: Secondary | ICD-10-CM | POA: Diagnosis present

## 2018-04-29 DIAGNOSIS — K831 Obstruction of bile duct: Secondary | ICD-10-CM | POA: Diagnosis present

## 2018-04-29 DIAGNOSIS — Z3A37 37 weeks gestation of pregnancy: Secondary | ICD-10-CM

## 2018-04-29 DIAGNOSIS — Q513 Bicornate uterus: Secondary | ICD-10-CM

## 2018-04-29 DIAGNOSIS — O26613 Liver and biliary tract disorders in pregnancy, third trimester: Secondary | ICD-10-CM

## 2018-04-29 LAB — COMPREHENSIVE METABOLIC PANEL
ALT: 11 U/L — ABNORMAL LOW (ref 14–54)
ANION GAP: 11 (ref 5–15)
AST: 23 U/L (ref 15–41)
Albumin: 3.2 g/dL — ABNORMAL LOW (ref 3.5–5.0)
Alkaline Phosphatase: 104 U/L (ref 38–126)
BUN: 9 mg/dL (ref 6–20)
CHLORIDE: 104 mmol/L (ref 101–111)
CO2: 21 mmol/L — AB (ref 22–32)
CREATININE: 0.71 mg/dL (ref 0.44–1.00)
Calcium: 8.9 mg/dL (ref 8.9–10.3)
Glucose, Bld: 87 mg/dL (ref 65–99)
Potassium: 3.9 mmol/L (ref 3.5–5.1)
SODIUM: 136 mmol/L (ref 135–145)
Total Bilirubin: 0.3 mg/dL (ref 0.3–1.2)
Total Protein: 6.4 g/dL — ABNORMAL LOW (ref 6.5–8.1)

## 2018-04-29 LAB — CBC
HCT: 37.3 % (ref 36.0–46.0)
Hemoglobin: 12.9 g/dL (ref 12.0–15.0)
MCH: 30.8 pg (ref 26.0–34.0)
MCHC: 34.6 g/dL (ref 30.0–36.0)
MCV: 89 fL (ref 78.0–100.0)
PLATELETS: 131 10*3/uL — AB (ref 150–400)
RBC: 4.19 MIL/uL (ref 3.87–5.11)
RDW: 13.8 % (ref 11.5–15.5)
WBC: 7.3 10*3/uL (ref 4.0–10.5)

## 2018-04-29 LAB — TYPE AND SCREEN
ABO/RH(D): O POS
ANTIBODY SCREEN: NEGATIVE

## 2018-04-29 MED ORDER — MISOPROSTOL 200 MCG PO TABS
ORAL_TABLET | ORAL | Status: AC
  Filled 2018-04-29: qty 1

## 2018-04-29 MED ORDER — SOD CITRATE-CITRIC ACID 500-334 MG/5ML PO SOLN: 30.0000 mL | ORAL | Status: DC | PRN

## 2018-04-29 MED ORDER — TERBUTALINE SULFATE 1 MG/ML IJ SOLN
0.2500 mg | Freq: Once | INTRAMUSCULAR | Status: DC | PRN
  Filled 2018-04-29: qty 1

## 2018-04-29 MED ORDER — LACTATED RINGERS IV SOLN: 500.0000 mL | INTRAVENOUS | Status: DC | PRN

## 2018-04-29 MED ORDER — FENTANYL CITRATE (PF) 100 MCG/2ML IJ SOLN
100.0000 ug | INTRAMUSCULAR | Status: DC | PRN
  Administered 2018-04-29 – 2018-04-30 (×3): 100 ug via INTRAVENOUS
  Filled 2018-04-29 (×3): qty 2

## 2018-04-29 MED ORDER — OXYTOCIN 40 UNITS IN LACTATED RINGERS INFUSION - SIMPLE MED: 2.5000 [IU]/h | INTRAVENOUS | Status: DC

## 2018-04-29 MED ORDER — ONDANSETRON HCL 4 MG/2ML IJ SOLN
4.0000 mg | Freq: Four times a day (QID) | INTRAMUSCULAR | Status: DC | PRN
  Administered 2018-04-30: 4 mg via INTRAVENOUS
  Filled 2018-04-29: qty 2

## 2018-04-29 MED ORDER — LACTATED RINGERS IV SOLN: INTRAVENOUS | Status: DC

## 2018-04-29 MED ORDER — LIDOCAINE HCL (PF) 1 % IJ SOLN
30.0000 mL | INTRAMUSCULAR | Status: DC | PRN
  Filled 2018-04-29: qty 30

## 2018-04-29 MED ORDER — OXYTOCIN BOLUS FROM INFUSION: 500.0000 mL | Freq: Once | INTRAVENOUS | Status: DC

## 2018-04-29 MED ORDER — ACETAMINOPHEN 325 MG PO TABS: 650.0000 mg | ORAL_TABLET | ORAL | Status: DC | PRN

## 2018-04-29 MED ORDER — MISOPROSTOL 50MCG HALF TABLET
50.0000 ug | ORAL_TABLET | ORAL | Status: DC
  Administered 2018-04-29 – 2018-04-30 (×3): 50 ug via BUCCAL
  Filled 2018-04-29 (×3): qty 1

## 2018-04-29 MED ORDER — MISOPROSTOL 25 MCG QUARTER TABLET
25.0000 ug | ORAL_TABLET | ORAL | Status: DC
  Administered 2018-04-29: 25 ug via VAGINAL
  Filled 2018-04-29: qty 1

## 2018-04-29 NOTE — H&P (Addendum)
Obstetric History and Physical  Robyn Harvey is a 27 y.o. G2P0010 with IUP at [redacted]w[redacted]d presenting for IOL for cholestasis. Patient previously has been breech/transverse/oblique and had opted to have ECV done today. Patient states she has been having  irregular, every few minutes contractions, none vaginal bleeding, intact membranes, with active fetal movement.    Prenatal Course Source of Care: [facility]  with onset of care at 11 weeks Pregnancy complications or risks: Patient Active Problem List   Diagnosis Date Noted  . Cholestasis during pregnancy in third trimester 02/27/2018  . Bicornate uterus complicating pregnancy 96/22/2979  . Language barrier 11/15/2017  . Supervision of high risk pregnancy, antepartum 11/03/2017   She plans to breastfeed She desires vasectomy for postpartum contraception.   Prenatal labs and studies: ABO, Rh: O/Positive/-- (12/20 1631) Antibody: Negative (12/20 1631) Rubella: 9.17 (12/20 1631) RPR: Non Reactive (04/23 1113)  HBsAg: Negative (12/20 1631)  HIV: Non Reactive (04/23 1113)  GXQ:JJHERDEY (06/06 1554) 2 hr GTT:  83/101/87 Genetic screening declined Anatomy US normal  Medical History:  Past Medical History:  Diagnosis Date  . Bicornuate uterus   . Cholestasis during pregnancy in third trimester   . Fibroid   . Hernia, umbilical     History reviewed. No pertinent surgical history.  OB History  Gravida Para Term Preterm AB Living  2 0     1 0  SAB TAB Ectopic Multiple Live Births  1            # Outcome Date GA Lbr Len/2nd Weight Sex Delivery Anes PTL Lv  2 Current           1 SAB 11/08/16 [redacted]w[redacted]d           Social History   Socioeconomic History  . Marital status: Married    Spouse name: Not on file  . Number of children: Not on file  . Years of education: Not on file  . Highest education level: Not on file  Occupational History  . Not on file  Social Needs  . Financial resource strain: Not on file  . Food  insecurity:    Worry: Not on file    Inability: Not on file  . Transportation needs:    Medical: Not on file    Non-medical: Not on file  Tobacco Use  . Smoking status: Never Smoker  . Smokeless tobacco: Never Used  Substance and Sexual Activity  . Alcohol use: No  . Drug use: No  . Sexual activity: Yes  Lifestyle  . Physical activity:    Days per week: Not on file    Minutes per session: Not on file  . Stress: Not on file  Relationships  . Social connections:    Talks on phone: Not on file    Gets together: Not on file    Attends religious service: Not on file    Active member of club or organization: Not on file    Attends meetings of clubs or organizations: Not on file    Relationship status: Not on file  Other Topics Concern  . Not on file  Social History Narrative   ** Merged History Encounter **        Family History  Problem Relation Age of Onset  . Hypertension Mother   . Down syndrome Sister     Medications Prior to Admission  Medication Sig Dispense Refill Last Dose  . famotidine (PEPCID) 20 MG tablet Take 1 tablet (20 mg total)  by mouth 2 (two) times daily. 60 tablet 6 04/28/2018 at Unknown time  . Prenatal Vit-Fe Fumarate-FA (PRENATAL MULTIVITAMIN) TABS tablet Take 1 tablet daily at 12 noon by mouth.   04/28/2018 at Unknown time  . ursodiol (URSO FORTE) 500 MG tablet Take 1 tablet (500 mg total) by mouth 3 (three) times daily. 90 tablet 2 04/28/2018 at Unknown time  . Elastic Bandages & Supports (COMFORT FIT MATERNITY SUPP LG) MISC 1 Units by Does not apply route daily. (Patient not taking: Reported on 04/27/2018) 1 each 0 Not Taking  . hydrOXYzine (VISTARIL) 50 MG capsule Take 1 capsule (50 mg total) by mouth 3 (three) times daily as needed. (Patient not taking: Reported on 04/06/2018) 30 capsule 2 04/27/2018  . Prenatal Multivit-Min-Fe-FA (PRENATAL VITAMINS) 0.8 MG tablet Take 1 tablet by mouth daily. (Patient not taking: Reported on 04/06/2018) 30 tablet 12 Not  Taking    No Known Allergies  Review of Systems: Negative except for what is mentioned in HPI.  Physical Exam: BP 130/73   Pulse 100   Temp 97.9 F (36.6 C)   LMP 08/14/2017  CONSTITUTIONAL: Well-developed, well-nourished female in no acute distress.  HENT:  Normocephalic, atraumatic, External right and left ear normal. Oropharynx is clear and moist EYES: Conjunctivae and EOM are normal. Pupils are equal, round, and reactive to light. No scleral icterus.  NECK: Normal range of motion, supple, no masses SKIN: Skin is warm and dry. No rash noted. Not diaphoretic. No erythema. No pallor. NEUROLOGIC: Alert and oriented to person, place, and time. Normal reflexes, muscle tone coordination. No cranial nerve deficit noted. PSYCHIATRIC: Normal mood and affect. Normal behavior. Normal judgment and thought content. CARDIOVASCULAR: Normal heart rate noted, regular rhythm RESPIRATORY: Effort and breath sounds normal, no problems with respiration noted ABDOMEN: Soft, nontender, nondistended, gravid. MUSCULOSKELETAL: Normal range of motion. No edema and no tenderness. 2+ distal pulses.  Cervical Exam: Dilatation closed per RN   Presentation: cephalic, patient scanned at bedside by myself and Dr. Arlyn Dunning:  Baseline rate 140s bpm   Variability moderate  Accelerations present   Decelerations none Contractions: Every 8 min mins   Pertinent Labs/Studies:   No results found for this or any previous visit (from the past 24 hour(s)).  Assessment : Robyn Harvey is a 27 y.o. G2P0010 at [redacted]w[redacted]d being admitted for induction of labor due to cholestasis of pregnancy. Previously malpositioned, however on ultrasound today, patient is cephalic. Patient scanned at bedside by myself and Dr. Roselie Awkward. Will proceed with induction. Reviewed risks and that if baby becomes malpositioned during labor, she will need a c-section, she verbalizes understanding.  Plan:  Labor  - Admit to labor and delivery -  Induction/Augmentation as ordered as per protocol - Analgesia as needed  FWB  - Cat I fetal heart tracing   - GBS negative  Cholestasis - hydralazine prn  Language barrier - Patent attorney used/present  Delivery plan - Hopeful for vaginal delivery    K. Arvilla Meres, M.D. Attending [place], [facility] for [employer], [employer]  04/29/2018, 9:27 AM

## 2018-04-29 NOTE — Anesthesia Pain Management Evaluation Note (Signed)
  CRNA Pain Management Visit Note  Patient: Robyn Harvey, 27 y.o., female  "Hello I am a member of the anesthesia team at [facility]. We have an anesthesia team available at all times to provide care throughout the hospital, including epidural management and anesthesia for C-section. I don't know your plan for the delivery whether it a natural birth, water birth, IV sedation, nitrous supplementation, doula or epidural, but we want to meet your pain goals."   1.Was your pain managed to your expectations on prior hospitalizations?   No prior hospitalizations  2.What is your expectation for pain management during this hospitalization?     IV pain meds  3.How can we help you reach that goal? Support PRN  Record the patient's initial score and the patient's pain goal.   Pain: 8  Pain Goal: 3 [facility] wants you to be able to say your pain was always managed very well.  Kathie Rhodes 04/29/2018

## 2018-04-29 NOTE — Progress Notes (Signed)
Labor Progress Note Robyn Harvey is a 27 y.o. G2P0010 at [redacted]w[redacted]d presented for IOL for cholestasis. S: Feeling mild pain with occasional contractions.   O:  BP 130/73   Pulse 100   Temp 97.9 F (36.6 C)   LMP 08/14/2017  EFM: 135 bpm/moderate variability/+ accel, no decel  CVE: Dilation: Closed Effacement (%): 50 Station: -2 Presentation: Vertex Exam by:: m wilkins rnc   A&P: 27 y.o. G2P0010 100w0d presenting for IOL for cholestatis.  #Labor: cytotec ordered, second dose just placed. Continue cytotec until ready for FB.  #Pain: per patient request #FWB: Cat 1 FHT #GBS negative  [name], MD 1:16 PM

## 2018-04-29 NOTE — Progress Notes (Signed)
Labor Progress Note Robyn Harvey is a 27 y.o. G2P0010 at [redacted]w[redacted]d presented for IOL for cholestasis. S: Feeling more pain with contractions. Has been up walking around the floor.   O:  BP 124/76   Pulse 89   Temp 98.3 F (36.8 C)   LMP 08/14/2017  EFM: 140 bpm/moderate variability/+ accel, no decel  CVE: Dilation: Closed Effacement (%): 70 Station: -2 Presentation: Vertex Exam by:: greer   A&P: 27 y.o. G2P0010 [redacted]w[redacted]d presenting for IOL for cholestatis.  #Labor: SVE remains unchanged at 0/70/-3. Pt due for 3rd dose of cytotec.  #Pain: per patient request #FWB: Cat 1 FHT #GBS negative  Vira Agar, MD 7:23 PM

## 2018-04-29 NOTE — Progress Notes (Addendum)
Labor Progress Note  Robyn Harvey is a 27 y.o. G2P0010 at [redacted]w[redacted]d admitted for induction of labor due to cholestasis.  S: Uncomfortable with contractions.   O:  BP 117/82   Pulse 89   Temp (!) 97.4 F (36.3 C) (Oral)   Resp 18   LMP 08/14/2017   No intake/output data recorded.  FHT:  FHR: 140 bpm, variability: moderate,  accelerations:  Present,  decelerations:  Absent UC:   regular, every 1-3 minutes SVE:   Dilation: Fingertip Effacement (%): Thick Station: Ballotable Exam by:: Dr. Gerarda Fraction  SROM:  Clear 1355  Bedside US: re-confirmed vertex presentation with [place]  Labs: Lab Results  Component Value Date   WBC 7.3 04/29/2018   HGB 12.9 04/29/2018   HCT 37.3 04/29/2018   MCV 89.0 04/29/2018   PLT 131 (L) 04/29/2018    Assessment / Plan: 27 y.o. G2P0010 [redacted]w[redacted]d in early labor Induction of labor due to cholestasis  Labor: Progressing normally. Continue cytotec for ripening. FB placed.  Fetal Wellbeing:  Category I Pain Control:  Labor support without medications Anticipated MOD:  NSVD  Expectant management   Luiz Blare, DO OB Fellow Center for [facility], [facility]

## 2018-04-30 ENCOUNTER — Inpatient Hospital Stay (HOSPITAL_COMMUNITY): Payer: Medicaid Other | Admitting: Anesthesiology

## 2018-04-30 ENCOUNTER — Encounter (HOSPITAL_COMMUNITY): Payer: Self-pay

## 2018-04-30 DIAGNOSIS — O2662 Liver and biliary tract disorders in childbirth: Secondary | ICD-10-CM

## 2018-04-30 DIAGNOSIS — Z3A37 37 weeks gestation of pregnancy: Secondary | ICD-10-CM

## 2018-04-30 DIAGNOSIS — K831 Obstruction of bile duct: Secondary | ICD-10-CM

## 2018-04-30 LAB — CBC
HCT: 38.8 % (ref 36.0–46.0)
Hemoglobin: 13.3 g/dL (ref 12.0–15.0)
MCH: 30.1 pg (ref 26.0–34.0)
MCHC: 34.3 g/dL (ref 30.0–36.0)
MCV: 87.8 fL (ref 78.0–100.0)
PLATELETS: 135 10*3/uL — AB (ref 150–400)
RBC: 4.42 MIL/uL (ref 3.87–5.11)
RDW: 14.1 % (ref 11.5–15.5)
WBC: 13.6 10*3/uL — AB (ref 4.0–10.5)

## 2018-04-30 LAB — RPR: RPR: NONREACTIVE

## 2018-04-30 MED ORDER — METHYLERGONOVINE MALEATE 0.2 MG/ML IJ SOLN
0.2000 mg | Freq: Once | INTRAMUSCULAR | Status: AC
  Administered 2018-04-30: 0.2 mg via INTRAMUSCULAR

## 2018-04-30 MED ORDER — DIPHENHYDRAMINE HCL 25 MG PO CAPS: 25.0000 mg | ORAL_CAPSULE | Freq: Four times a day (QID) | ORAL | Status: DC | PRN

## 2018-04-30 MED ORDER — LACTATED RINGERS IV SOLN
500.0000 mL | Freq: Once | INTRAVENOUS | Status: AC
  Administered 2018-04-30: 500 mL via INTRAVENOUS

## 2018-04-30 MED ORDER — LIDOCAINE HCL (PF) 1 % IJ SOLN
INTRAMUSCULAR | Status: DC | PRN
  Administered 2018-04-30: 6 mL via EPIDURAL
  Administered 2018-04-30: 4 mL via EPIDURAL

## 2018-04-30 MED ORDER — METHYLERGONOVINE MALEATE 0.2 MG/ML IJ SOLN
INTRAMUSCULAR | Status: AC
  Filled 2018-04-30: qty 1

## 2018-04-30 MED ORDER — ONDANSETRON HCL 4 MG PO TABS: 4.0000 mg | ORAL_TABLET | ORAL | Status: DC | PRN

## 2018-04-30 MED ORDER — ONDANSETRON HCL 4 MG/2ML IJ SOLN: 4.0000 mg | INTRAMUSCULAR | Status: DC | PRN

## 2018-04-30 MED ORDER — ZOLPIDEM TARTRATE 5 MG PO TABS: 5.0000 mg | ORAL_TABLET | Freq: Every evening | ORAL | Status: DC | PRN

## 2018-04-30 MED ORDER — BENZOCAINE-MENTHOL 20-0.5 % EX AERO
1.0000 "application " | INHALATION_SPRAY | CUTANEOUS | Status: DC | PRN
  Administered 2018-04-30: 1 via TOPICAL
  Filled 2018-04-30: qty 56

## 2018-04-30 MED ORDER — WITCH HAZEL-GLYCERIN EX PADS: 1.0000 "application " | MEDICATED_PAD | CUTANEOUS | Status: DC | PRN

## 2018-04-30 MED ORDER — TETANUS-DIPHTH-ACELL PERTUSSIS 5-2.5-18.5 LF-MCG/0.5 IM SUSP: 0.5000 mL | Freq: Once | INTRAMUSCULAR | Status: DC

## 2018-04-30 MED ORDER — COCONUT OIL OIL
1.0000 "application " | TOPICAL_OIL | Status: DC | PRN
  Administered 2018-04-30: 1 via TOPICAL
  Filled 2018-04-30: qty 120

## 2018-04-30 MED ORDER — BUTORPHANOL TARTRATE 1 MG/ML IJ SOLN
2.0000 mg | Freq: Once | INTRAMUSCULAR | Status: AC
  Administered 2018-04-30: 2 mg via INTRAVENOUS
  Filled 2018-04-30: qty 2

## 2018-04-30 MED ORDER — SODIUM CHLORIDE 0.9% FLUSH: 3.0000 mL | Freq: Two times a day (BID) | INTRAVENOUS | Status: DC

## 2018-04-30 MED ORDER — SIMETHICONE 80 MG PO CHEW: 80.0000 mg | CHEWABLE_TABLET | ORAL | Status: DC | PRN

## 2018-04-30 MED ORDER — EPHEDRINE 5 MG/ML INJ
10.0000 mg | INTRAVENOUS | Status: DC | PRN
  Filled 2018-04-30: qty 2

## 2018-04-30 MED ORDER — SODIUM CHLORIDE 0.9 % IV SOLN: 250.0000 mL | INTRAVENOUS | Status: DC | PRN

## 2018-04-30 MED ORDER — PHENYLEPHRINE 40 MCG/ML (10ML) SYRINGE FOR IV PUSH (FOR BLOOD PRESSURE SUPPORT)
80.0000 ug | PREFILLED_SYRINGE | INTRAVENOUS | Status: DC | PRN
  Filled 2018-04-30: qty 10
  Filled 2018-04-30: qty 5

## 2018-04-30 MED ORDER — URSODIOL 500 MG PO TABS
500.0000 mg | ORAL_TABLET | Freq: Three times a day (TID) | ORAL | Status: DC
  Administered 2018-04-30 – 2018-05-02 (×5): 500 mg via ORAL
  Filled 2018-04-30 (×3): qty 1

## 2018-04-30 MED ORDER — SODIUM CHLORIDE 0.9% FLUSH: 3.0000 mL | INTRAVENOUS | Status: DC | PRN

## 2018-04-30 MED ORDER — SENNOSIDES-DOCUSATE SODIUM 8.6-50 MG PO TABS
2.0000 | ORAL_TABLET | ORAL | Status: DC
  Administered 2018-05-01 (×2): 2 via ORAL
  Filled 2018-04-30 (×2): qty 2

## 2018-04-30 MED ORDER — ACETAMINOPHEN 325 MG PO TABS
650.0000 mg | ORAL_TABLET | ORAL | Status: DC | PRN
  Administered 2018-05-01: 650 mg via ORAL
  Filled 2018-04-30: qty 2

## 2018-04-30 MED ORDER — TERBUTALINE SULFATE 1 MG/ML IJ SOLN: 0.2500 mg | Freq: Once | INTRAMUSCULAR | Status: DC

## 2018-04-30 MED ORDER — IBUPROFEN 600 MG PO TABS
600.0000 mg | ORAL_TABLET | Freq: Four times a day (QID) | ORAL | Status: DC
  Administered 2018-04-30 – 2018-05-02 (×8): 600 mg via ORAL
  Filled 2018-04-30 (×8): qty 1

## 2018-04-30 MED ORDER — TERBUTALINE SULFATE 1 MG/ML IJ SOLN
0.2500 mg | Freq: Once | INTRAMUSCULAR | Status: DC | PRN
  Filled 2018-04-30: qty 1

## 2018-04-30 MED ORDER — PHENYLEPHRINE 40 MCG/ML (10ML) SYRINGE FOR IV PUSH (FOR BLOOD PRESSURE SUPPORT)
80.0000 ug | PREFILLED_SYRINGE | INTRAVENOUS | Status: DC | PRN
  Administered 2018-04-30: 80 ug via INTRAVENOUS
  Filled 2018-04-30: qty 5

## 2018-04-30 MED ORDER — DIBUCAINE 1 % RE OINT: 1.0000 "application " | TOPICAL_OINTMENT | RECTAL | Status: DC | PRN

## 2018-04-30 MED ORDER — DIPHENHYDRAMINE HCL 50 MG/ML IJ SOLN: 12.5000 mg | INTRAMUSCULAR | Status: DC | PRN

## 2018-04-30 MED ORDER — FENTANYL 2.5 MCG/ML BUPIVACAINE 1/10 % EPIDURAL INFUSION (WH - ANES)
14.0000 mL/h | INTRAMUSCULAR | Status: DC | PRN
  Administered 2018-04-30: 14 mL/h via EPIDURAL
  Filled 2018-04-30: qty 100

## 2018-04-30 MED ORDER — PRENATAL MULTIVITAMIN CH
1.0000 | ORAL_TABLET | Freq: Every day | ORAL | Status: DC
  Administered 2018-05-01: 1 via ORAL
  Filled 2018-04-30: qty 1

## 2018-04-30 MED ORDER — OXYTOCIN 40 UNITS IN LACTATED RINGERS INFUSION - SIMPLE MED
1.0000 m[IU]/min | INTRAVENOUS | Status: DC
  Administered 2018-04-30: 2 m[IU]/min via INTRAVENOUS
  Filled 2018-04-30: qty 1000

## 2018-04-30 MED ORDER — HYDROXYZINE PAMOATE 50 MG PO CAPS
50.0000 mg | ORAL_CAPSULE | Freq: Three times a day (TID) | ORAL | Status: DC | PRN
  Filled 2018-04-30: qty 1

## 2018-04-30 MED ORDER — DIPHENHYDRAMINE HCL 50 MG/ML IJ SOLN
25.0000 mg | Freq: Once | INTRAMUSCULAR | Status: AC
  Administered 2018-04-30: 25 mg via INTRAVENOUS
  Filled 2018-04-30: qty 1

## 2018-04-30 MED ORDER — MEASLES, MUMPS & RUBELLA VAC ~~LOC~~ INJ
0.5000 mL | INJECTION | Freq: Once | SUBCUTANEOUS | Status: DC
  Filled 2018-04-30: qty 0.5

## 2018-04-30 NOTE — Progress Notes (Signed)
Labor Progress Note  Robyn Harvey is a 27 y.o. G2P0010 at [redacted]w[redacted]d admitted for induction of labor due to cholestasis.  S: Uncomfortable with contractions. Requesting epidural.  O:  BP 112/61   Pulse 92   Temp 98.1 F (36.7 C) (Oral)   Resp 18   LMP 08/14/2017   SpO2 96%   No intake/output data recorded.  FHT:  FHR: 145 bpm, variability: moderate,  accelerations:  Present,  decelerations:  Present variable UC:   regular, every 2-4 minutes SVE:   Dilation: 5.5 Effacement (%): Thick Station: -1 Exam by:: [employer] RN SROM:  Clear 9509  Labs: Lab Results  Component Value Date   WBC 13.6 (H) 04/30/2018   HGB 13.3 04/30/2018   HCT 38.8 04/30/2018   MCV 87.8 04/30/2018   PLT 135 (L) 04/30/2018    Assessment / Plan: 27 y.o. G2P0010 [redacted]w[redacted]d in early labor Induction of labor due to cholestasis  Labor: Progressing normally. S/p FB and cytotec. Will start Pitocin. Fetal Wellbeing:  Category I Pain Control:  Epidural Anticipated MOD:  NSVD  Expectant management   Robyn Blare, DO OB Fellow Center for [facility], [facility]

## 2018-04-30 NOTE — Progress Notes (Signed)
Labor Progress Note  Robyn Harvey is a 27 y.o. G2P0010 at [redacted]w[redacted]d admitted for induction of labor due to cholestasis.  S: Uncomfortable with contractions.   O:  BP (!) 141/74   Pulse 95   Temp 98.4 F (36.9 C) (Oral)   Resp 18   LMP 08/14/2017   No intake/output data recorded.  FHT:  FHR: 140 bpm, variability: moderate,  accelerations:  Present,  decelerations:  Absent UC:   regular, every 1-4 minutes SVE:   Dilation: Fingertip Effacement (%): Thick Station: Ballotable Exam by:: Dr. Gerarda Fraction  SROM:  Clear 1355  Labs: Lab Results  Component Value Date   WBC 7.3 04/29/2018   HGB 12.9 04/29/2018   HCT 37.3 04/29/2018   MCV 89.0 04/29/2018   PLT 131 (L) 04/29/2018    Assessment / Plan: 27 y.o. G2P0010 [redacted]w[redacted]d in early labor Induction of labor due to cholestasis  Labor: Progressing normally. Continue cytotec for ripening. FB in place.   Fetal Wellbeing:  Category I Pain Control:  Labor support without medications Anticipated MOD:  NSVD  Expectant management   Luiz Blare, DO OB Fellow Center for [facility], [facility]

## 2018-04-30 NOTE — Anesthesia Postprocedure Evaluation (Signed)
Anesthesia Post Note  Patient: Robyn Harvey  Procedure(s) Performed: AN AD [place]     Patient location during evaluation: Mother Baby Anesthesia Type: Epidural Level of consciousness: awake Pain management: satisfactory to patient Vital Signs Assessment: post-procedure vital signs reviewed and stable Respiratory status: spontaneous breathing Cardiovascular status: stable Anesthetic complications: no    Last Vitals:  Vitals:   04/30/18 1332 04/30/18 1430  BP: 119/65 105/65  Pulse: 86 94  Resp: 18 18  Temp: (!) 38.1 C 37.3 C  SpO2: 96% 97%    Last Pain:  Vitals:   04/30/18 1430  TempSrc: Oral  PainSc: 2    Pain Goal: Patients Stated Pain Goal: 2 (04/30/18 1430)               Casimer Lanius

## 2018-04-30 NOTE — Anesthesia Preprocedure Evaluation (Signed)
Anesthesia Evaluation  Patient identified by MRN, date of birth, ID band Patient awake    Reviewed: Allergy & Precautions, NPO status , Patient's Chart, lab work & pertinent test results  Airway Mallampati: II  TM Distance: >3 FB Neck ROM: Full    Dental  (+) Dental Advisory Given   Pulmonary neg pulmonary ROS,    breath sounds clear to auscultation       Cardiovascular negative cardio ROS   Rhythm:Regular Rate:Normal     Neuro/Psych negative neurological ROS  negative psych ROS   GI/Hepatic Neg liver ROS, Cholestasis    Endo/Other  negative endocrine ROS  Renal/GU negative Renal ROS  negative genitourinary   Musculoskeletal negative musculoskeletal ROS (+)   Abdominal   Peds  Hematology Thrombocytopenia   Anesthesia Other Findings   Reproductive/Obstetrics (+) Pregnancy Bicornuate uterus                             Anesthesia Physical Anesthesia Plan  ASA: II  Anesthesia Plan: Epidural   Post-op Pain Management:    Induction:   PONV Risk Score and Plan:   Airway Management Planned: Natural Airway  Additional Equipment: None  Intra-op Plan:   Post-operative Plan:   Informed Consent: I have reviewed the patients History and Physical, chart, labs and discussed the procedure including the risks, benefits and alternatives for the proposed anesthesia with the patient or authorized representative who has indicated his/her understanding and acceptance.     Plan Discussed with: Anesthesiologist  Anesthesia Plan Comments: (Labs reviewed. Platelets acceptable, patient not taking any blood thinning medications. Risks and benefits discussed with patient, patient expressed understanding and wished to proceed.)        Anesthesia Quick Evaluation

## 2018-04-30 NOTE — Lactation Note (Signed)
This note was copied from a baby's chart. Lactation Consultation Note  Patient Name: Robyn Harvey [facility] Date: 04/30/2018 Reason for consult: Initial assessment;1st time breastfeeding;NICU baby;Early term 20-38.6wks  14 hours old early term female who is being exclusively BF by her mother, she's a P1. Taught mom how to hand express and she was able to get 2 drops of colostrum out of her right breast. RN has already set her up with a hand pump and breast shells to help evert her nipples; they're flat. Mom already wearing her breast shells with her nursing bra when entering the room.  Baby was cueing, offered assistance with latch and mom agreed, took baby STS to the left breast in football position but she kept coming off doing lots of clicking noises. No swallows heard. Tried a NS #20 and baby was able to latch on right away and sustain the latch throughout the 20 minutes feeding. A few swallows heard on and off, then switch to multiple swallows when doing breast compressions only. Colostrum was observed at the end of the feeding on the NS but noticed that mom's nipple was pinched. Asked her RN for coconut oil and explained to mom how to use it for breast massage and prior pumping.  Even though mom is on a shield now, she's still not ready to double pump, she'll continue using her hand pump in the mean time. Asked mom to call for assistance whenever she's ready to start double pumping. Showed mom how to break the latch and how to use her hand pump. Milk storage guidelines were also discussed.  Encouraged mom to feed baby 8-12 times/24 hours or sooner if feeding cues are present. If baby is not cueing in a 3 hour period, she'll place her to the breast STS to give her an opportunity to feed. Mom will pump after feedings if using the NS # 20 and feed any amount of EBM to baby via spoon feeding or finger feeding. BF brochure (SP), BF resources and feeding diary were also discussed, mom is  aware of [facility] services and will call PRN.  Maternal Data Formula Feeding for Exclusion: Yes Reason for exclusion: Mother's choice to formula and breast feed on admission Has patient been taught Hand Expression?: Yes Does the patient have breastfeeding experience prior to this delivery?: No  Feeding Feeding Type: Breast Fed Length of feed: 20 min(Baby still nursing when exiting the room)  LATCH Score Latch: Grasps breast easily, tongue down, lips flanged, rhythmical sucking.(with NS # 20)  Audible Swallowing: A few with stimulation(with NS# 20 and breast compressions)  Type of Nipple: Flat  Comfort (Breast/Nipple): Soft / non-tender  Hold (Positioning): Assistance needed to correctly position infant at breast and maintain latch.  LATCH Score: 7  Interventions Interventions: Breast feeding basics reviewed;Assisted with latch;Skin to skin;Breast massage;Breast compression;Adjust position;Support pillows;Coconut oil;Shells;Hand pump;Hand express  Lactation Tools Discussed/Used Tools: Shells;Pump;Coconut oil;Nipple Shields Nipple shield size: 20 Shell Type: Inverted Breast pump type: Manual WIC Program: No Pump Review: Setup, frequency, and cleaning;Milk Storage Initiated by:: MPeck Date initiated:: 04/30/18   Consult Status Consult Status: Follow-up Date: 05/01/18 Follow-up type: In-patient    Robyn Harvey 04/30/2018, 7:51 PM

## 2018-04-30 NOTE — Progress Notes (Signed)
Epidural catheter removed intact. Upon catheter removal bright red blood gushing from site. Pressure applied . Dr Glennon Mac to room to evaluate. Pressure dressing applied. Will continue to assess

## 2018-04-30 NOTE — Anesthesia Procedure Notes (Signed)
Epidural Patient location during procedure: OB Start time: 04/30/2018 4:19 AM End time: 04/30/2018 4:24 AM  Staffing Anesthesiologist: Audry Pili, MD Performed: anesthesiologist   Preanesthetic Checklist Completed: patient identified, pre-op evaluation, timeout performed, IV checked, risks and benefits discussed and monitors and equipment checked  Epidural Patient position: sitting Prep: DuraPrep Patient monitoring: continuous pulse ox and blood pressure Approach: midline Location: L2-L3 Injection technique: LOR saline  Needle:  Needle type: Tuohy  Needle gauge: 17 G Needle length: 9 cm Needle insertion depth: 6 cm Catheter size: 19 Gauge Catheter at skin depth: 11 cm Test dose: negative and Other (1% lidocaine)  Additional Notes Patient identified. Risks including, but not limited to, bleeding, infection, nerve damage, paralysis, inadequate analgesia, blood pressure changes, nausea, vomiting, allergic reaction, postpartum back pain, itching, and headache were discussed. Patient expressed understanding and wished to proceed. Sterile prep and drape, including hand hygiene, mask, and sterile gloves were used. The patient was positioned and the spine was prepped. The skin was anesthetized with lidocaine. No paraesthesia or other complication noted. The patient did not experience any signs of intravascular injection such as tinnitus or metallic taste in mouth, nor signs of intrathecal spread such as rapid motor block. Please see nursing notes for vital signs. The patient tolerated the procedure well.   Renold Don, MDReason for block:procedure for pain

## 2018-05-01 ENCOUNTER — Other Ambulatory Visit: Payer: Self-pay

## 2018-05-01 NOTE — Progress Notes (Addendum)
POSTPARTUM PROGRESS NOTE  Post Partum Day 1  Subjective:  Robyn Harvey is a 27 y.o. G2P1011 s/p SVD at [redacted]w[redacted]d.  She reports she is doing well. No acute events overnight. She denies any problems with ambulating, voiding or po intake. Denies nausea or vomiting.  Pain is well controlled.  Lochia is normal.  Objective: Blood pressure 103/69, pulse 81, temperature 98 F (36.7 C), temperature source Oral, resp. rate 14, last menstrual period 08/14/2017, SpO2 98 %, unknown if currently breastfeeding.  Physical Exam:  General: alert, cooperative and no distress Chest: no respiratory distress Heart:regular rate, distal pulses intact Abdomen: soft, nontender,  Uterine Fundus: firm, appropriately tender DVT Evaluation: No calf swelling or tenderness Extremities: no edema Skin: warm, dry  Recent Labs    04/29/18 0933 04/30/18 0343  HGB 12.9 13.3  HCT 37.3 38.8    Assessment/Plan: Robyn Harvey is a 27 y.o. G2P1011 s/p SVD at [redacted]w[redacted]d after IOL for cholestasis.  PPD#1 - Doing well. Routine postpartum care Contraception: condoms, declines LARCs today  Feeding: breast Dispo: Plan for discharge tomorrow on PPD.   LOS: 2 days   [place] 05/01/2018, 7:31 AM   I confirm that I have verified the information documented in the resident's note and that I have also personally reperformed the physical exam and all medical decision making activities. Patient was seen and examined by me also Agree with note Vitals stable Labs stable Fundus firm, lochia within normal limits Perineum healing Ext WNL Continue care Anticipate discharge tomorrow  Seabron Spates, CNM

## 2018-05-01 NOTE — Progress Notes (Signed)
Patient's husband ordering all of her meals.[place] Interpreter.

## 2018-05-02 MED ORDER — IBUPROFEN 600 MG PO TABS: 600.0000 mg | ORAL_TABLET | Freq: Four times a day (QID) | ORAL | 0 refills | Status: AC

## 2018-05-02 NOTE — Discharge Summary (Addendum)
OB Discharge Summary     Patient Name: Robyn Harvey DOB: 07-12-1991 MRN: [id number]  Date of admission: 04/29/2018 Delivering MD: Yvonne Kendall A   Date of discharge: 05/02/2018  Admitting diagnosis: induction 37 wks Intrauterine pregnancy: [redacted]w[redacted]d     Secondary diagnosis:  Active Problems:   Cholestasis during pregnancy in third trimester   Spontaneous vaginal delivery  Additional problems: bicornuate uterus     Discharge diagnosis: Term Pregnancy Delivered                                                                                                Post partum procedures:none  Augmentation: Cytotec and Foley Balloon  Complications: None  Hospital course:  Induction of Labor With Vaginal Delivery   27 y.o. yo G2P1011 at [redacted]w[redacted]d was admitted to the hospital 04/29/2018 for induction of labor.  Indication for induction: Unstable lie.  Patient had an uncomplicated labor course as follows: Membrane Rupture Time/Date: 1:55 PM ,04/29/2018   Intrapartum Procedures: Episiotomy: None [1]                                         Lacerations:  2nd degree [3]  Patient had delivery of a Viable infant.  Information for the patient's newborn:  Crissie, Aloi [[id number]]  Delivery Method: Vaginal, Spontaneous(Filed from Delivery Summary)   04/30/2018  Details of delivery can be found in separate delivery note.  Patient had a routine postpartum course. Patient is discharged home 05/02/18.  Physical exam  Vitals:   05/01/18 0900 05/01/18 1423 05/01/18 2221 05/02/18 0500  BP: 103/64 104/67 112/67 109/64  Pulse: (!) 102 88 74 86  Resp:  16 18   Temp:  98.4 F (36.9 C) 98.9 F (37.2 C) 98.2 F (36.8 C)  TempSrc:  Oral Oral Oral  SpO2:  98%     General: alert, cooperative and no distress Lochia: appropriate Uterine Fundus: firm Incision: N/A DVT Evaluation: No evidence of DVT seen on physical exam. No cords or calf tenderness. No significant calf/ankle  edema. Labs: Lab Results  Component Value Date   WBC 13.6 (H) 04/30/2018   HGB 13.3 04/30/2018   HCT 38.8 04/30/2018   MCV 87.8 04/30/2018   PLT 135 (L) 04/30/2018   CMP Latest Ref Rng & Units 04/29/2018  Glucose 65 - 99 mg/dL 87  BUN 6 - 20 mg/dL 9  Creatinine 0.44 - 1.00 mg/dL 0.71  Sodium 135 - 145 mmol/L 136  Potassium 3.5 - 5.1 mmol/L 3.9  Chloride 101 - 111 mmol/L 104  CO2 22 - 32 mmol/L 21(L)  Calcium 8.9 - 10.3 mg/dL 8.9  Total Protein 6.5 - 8.1 g/dL 6.4(L)  Total Bilirubin 0.3 - 1.2 mg/dL 0.3  Alkaline Phos 38 - 126 U/L 104  AST 15 - 41 U/L 23  ALT 14 - 54 U/L 11(L)    Discharge instruction: per After Visit Summary and "Baby and Me Booklet".  After visit meds:  Allergies as of 05/02/2018   No  Known Allergies     Medication List    STOP taking these medications   COMFORT FIT MATERNITY SUPP LG Misc   hydrOXYzine 50 MG capsule Commonly known as:  VISTARIL   Prenatal Vitamins 0.8 MG tablet   ursodiol 500 MG tablet Commonly known as:  URSO FORTE     TAKE these medications   famotidine 20 MG tablet Commonly known as:  PEPCID Take 1 tablet (20 mg total) by mouth 2 (two) times daily.   ibuprofen 600 MG tablet Commonly known as:  ADVIL,MOTRIN Take 1 tablet (600 mg total) by mouth every 6 (six) hours.   prenatal multivitamin Tabs tablet Take 1 tablet daily at 12 noon by mouth.       Diet: routine diet  Activity: Advance as tolerated. Pelvic rest for 6 weeks.   Outpatient follow up:4 weeks Follow up Appt:No future appointments. Follow up Visit:No follow-ups on file.  Postpartum contraception: Condoms  Newborn Data: Live born female  Birth Weight: 6 lb 12.3 oz (3070 g) APGAR: 7, 9  Newborn Delivery   Birth date/time:  04/30/2018 11:37:00 Delivery type:  Vaginal, Spontaneous     Baby Feeding: Breast Disposition:home with mother   05/02/2018 Cari Caraway Hipkins, MD   I confirm that I have verified the information documented in the  resident's note and that I have also personally reperformed the physical exam and all medical decision making activities.  Lajean Manes, CNM 05/02/18, 10:58 AM

## 2018-05-02 NOTE — Discharge Instructions (Signed)
Parto vaginal, cuidados posteriores  Vaginal Delivery, Care After  Siga estas instrucciones durante las prximas semanas. Estas indicaciones le proporcionan informacin acerca de cmo deber cuidarse despus del parto vaginal. Su mdico tambin podr darle indicaciones ms especficas. El tratamiento ha sido planificado segn las prcticas mdicas actuales, pero en algunos casos pueden ocurrir problemas. Llame al mdico si tiene problemas o preguntas.  Qu puedo esperar despus del procedimiento?  Despus de un parto vaginal, es frecuente tener lo siguiente:   Hemorragia leve de la vagina.   Dolor en el abdomen, la vagina y la zona de la piel entre la abertura vaginal y el ano (perineo).   Calambres plvicos.   Fatiga.    Siga estas indicaciones en su casa:  Medicamentos   Tome los medicamentos de venta libre y los recetados solamente como se lo haya indicado el mdico.   Si le recetaron un antibitico, tmelo como se lo haya indicado el mdico. No interrumpa la administracin del antibitico hasta que lo haya terminado.  Conducir     No conduzca ni opere maquinaria pesada mientras toma analgsicos recetados.   No conduzca durante 24horas si le administraron un sedante.  Estilo de vida   No beba alcohol. Esto es de suma importancia si est amamantando o toma analgsicos.   No consuma productos que contengan tabaco, incluidos cigarrillos, tabaco de mascar o cigarrillos electrnicos. Si necesita ayuda para dejar de fumar, consulte al mdico.  Qu debe comer y beber   Beba al menos 8vasos de ochoonzas (240cc) de agua todos los das a menos que el mdico le indique lo contrario. Si elige amamantar al beb, quiz deba beber an ms cantidad de agua.   Coma alimentos ricos en fibras todos los das. Estos alimentos pueden ayudarla a prevenir o aliviar el estreimiento. Los alimentos ricos en fibras incluyen, entre otros:  ? Panes y cereales integrales.  ? Arroz integral.  ? Frijoles.  ? Frutas y verduras  frescas.  Actividad   Retome sus actividades normales como se lo haya indicado el mdico. Pregntele al mdico qu actividades son seguras para usted.   Descanse todo lo que pueda. Trate de descansar o tomar una siesta mientras el beb est durmiendo.   No levante objetos que pesen ms que su beb o 10libras (4,5kg) hasta que el mdico le diga que es seguro.   Hable con el mdico sobre cundo puede retomar la actividad sexual. Esto puede depender de lo siguiente:  ? Riesgo de sufrir una infeccin.  ? Velocidad de cicatrizacin.  ? Comodidad y deseo de retomar la actividad sexual.  Cuidados vaginales   Si le realizaron una episiotoma o tuvo un desgarro vaginal, contrlese la zona todos los das para detectar signos de infeccin. Est atenta a los siguientes signos:  ? Aumento del enrojecimiento, la hinchazn o el dolor.  ? Mayor presencia de lquido o sangre.  ? Calor.  ? Pus o mal olor.   No use tampones ni se haga duchas vaginales hasta que el mdico la autorice.   Controle la sangre que elimina por la vagina para detectar cogulos de sangre. Estos pueden tener el aspecto de grumos de color rojo oscuro, o secrecin marrn o negra.  Instrucciones generales   Mantenga el perineo limpio y seco, como se lo haya indicado el mdico.   Use ropa cmoda y suelta.   Cuando vaya al bao, siempre higiencese de adelante hacia atrs.   Pregntele al mdico si puede ducharse o tomar baos de inmersin.   Si se le realiz una episiotoma o tuvo un desgarro perineal durante el trabajo del parto o el parto, es posible que el mdico le indique que no tome baos de inmersin durante un determinado tiempo.   Use un sostn que sujete y ajuste bien sus pechos.   Si es posible, pdale a alguien que la ayude con las tareas del hogar y a cuidar del beb durante al menos algunos das despus de que le den el alta del hospital.   Concurra a todas las visitas de seguimiento para usted y el beb, como se lo haya indicado el  mdico. Esto es importante.  Comunquese con un mdico si:   Tiene los siguientes sntomas:  ? Secrecin vaginal que tiene mal olor.  ? Dificultad para orinar.  ? Dolor al orinar.  ? Aumento o disminucin repentinos de la frecuencia de las deposiciones.  ? Ms enrojecimiento, hinchazn o dolor alrededor de la episiotoma o del desgarro vaginal.  ? Ms secrecin de lquido o sangre de la episiotoma o del desgarro vaginal.  ? Pus o mal olor proveniente de la episiotoma o del desgarro vaginal.  ? Fiebre.  ? Erupcin cutnea.  ? Poco inters o falta de inters en actividades que solan gustarle.  ? Dudas sobre su cuidado y el del beb.   Siente la episiotoma o el desgarro vaginal caliente al tacto.   La episiotoma o el desgarro vaginal se abren o no parecen cicatrizar.   Siente dolor en las mamas, o estn duras o enrojecidas.   Siente tristeza o preocupacin de forma inusual.   Siente nuseas o vomita.   Elimina cogulos de sangre grandes por la vagina. Si expulsa un cogulo de sangre por la vagina, gurdelo para mostrrselo a su mdico. No tire la cadena sin que el mdico examine el cogulo de sangre antes.   Orina ms de lo habitual.   Se siente mareada o se desmaya.   No ha amamantado para nada y no ha tenido un perodo menstrual durante 12 semanas despus del parto.   Dej de amamantar al beb y no ha tenido su perodo menstrual durante 12 semanas despus de dejar de amamantar.  Solicite ayuda de inmediato si:   Tiene los siguientes sntomas:  ? Dolor que no desaparece o no mejora con medicamentos.  ? Dolor en el pecho.  ? Dificultad para respirar.  ? Visin borrosa o manchas en la vista.  ? Pensamientos de autolesionarse o lesionar al beb.   Comienza a sentir dolor en el abdomen o en una de las piernas.   Presenta un dolor de cabeza intenso.   Se desmaya.   Tiene una hemorragia de la vagina tan intensa que empapa dos toallitas sanitarias en una hora.  Esta informacin no tiene como fin  reemplazar el consejo del mdico. Asegrese de hacerle al mdico cualquier pregunta que tenga.  Document Released: 11/01/2005 Document Revised: 02/23/2017 Document Reviewed: 11/16/2015  Elsevier Interactive Patient Education  2018 [employer]

## 2018-05-02 NOTE — Lactation Note (Signed)
This note was copied from a baby's chart. Lactation Consultation Note  Patient Name: Robyn Harvey [id number] Date: 05/02/2018   Interpreter, "Nayibe" [phone], used during consult. Mom has a hand pump for home & says she has been shown how to use it. Mom says that breastfeeding is going well. Mom allowed me to stay to observe a feeding. Mom has been using a size 20 nipple shield. At the end of the short feeding, it appears that Mom could benefit from a size 24 nipple shield. Mom applied it & stated that it already felt better.  Parents to call us when infant cues again so we can ensure that infant can comfortably accommodate increase in size.  Infant is 48 hours old & has had 7 voids & 6 BMs since birth.   Matthias Hughs [facility] 05/02/2018, 10:03 AM

## 2018-05-02 NOTE — Progress Notes (Signed)
Discharge education completed with stratus spanish line.  Glenard Haring #[id number].

## 2018-05-02 NOTE — Lactation Note (Signed)
This note was copied from a baby's chart. Lactation Consultation Note  Patient Name: Robyn Harvey [id number] Date: 05/02/2018  Interpreter, Asencion Partridge [phone], used for consult. Mom was pleased with nursing with the size 24 NS. She was more comfortable & more swallows could be heard to the naked ear. Colostrum was visible in nipple shield when infant released latch. Mom knows how to apply nipple shield.   Specifics of an asymmetric latch shown via [employer]. Pictorial diaper sheet was provided to explain expected output over the next 5 days.   Mom knows to use her hand pump 4-6 times/day to hasten her milk coming to volume/decreasing likelihood of low milk supply. Mom reports that her breasts feel heavier today.  Spoon feeding was discussed in case "[name]" begins to lose interest in feeding (b/c of her 37-week gestation).   Infant to f/u with peds tomorrow.   Matthias Hughs [facility] 05/02/2018, 11:16 AM

## 2018-05-04 ENCOUNTER — Other Ambulatory Visit: Payer: Self-pay

## 2018-05-04 ENCOUNTER — Encounter: Payer: Self-pay | Admitting: Obstetrics and Gynecology

## 2018-05-30 ENCOUNTER — Encounter: Payer: Self-pay | Admitting: Obstetrics and Gynecology

## 2018-05-30 ENCOUNTER — Ambulatory Visit (INDEPENDENT_AMBULATORY_CARE_PROVIDER_SITE_OTHER): Payer: Medicaid Other | Admitting: Obstetrics and Gynecology

## 2018-05-30 DIAGNOSIS — Z1389 Encounter for screening for other disorder: Secondary | ICD-10-CM | POA: Diagnosis not present

## 2018-05-30 DIAGNOSIS — Q513 Bicornate uterus: Secondary | ICD-10-CM

## 2018-05-30 NOTE — Progress Notes (Signed)
Post Partum Exam  Robyn Harvey is a 27 y.o. G45P1011 female who presents for a postpartum visit. She is 4 weeks postpartum following a spontaneous vaginal delivery. IOl at 37 weeks d/t cholestasis if pregnancy I have fully reviewed the prenatal and intrapartum course. The delivery was at 82 gestational weeks.  Anesthesia: epidural. Postpartum course has been uncomplicated. Baby's course has been uncomplicated. Baby is feeding by both breast and bottle - Gerber. Bleeding staining only. Bowel function is normal. Bladder function is normal. Patient is not sexually active. Contraception method is none. Postpartum depression screening:neg She reports an occ HA. The following portions of the patient's history were reviewed and updated as appropriate: allergies, current medications, past family history, past medical history, past social history and past surgical history. Last pap smear 12/18, normal  Review of Systems Pertinent items noted in HPI and remainder of comprehensive ROS otherwise negative.    Objective:  unknown if currently breastfeeding.  General:  alert   Breasts:  deferred  Lungs: clear to auscultation bilaterally  Heart:  regular rate and rhythm, S1, S2 normal, no murmur, click, rub or gallop  Abdomen: soft, non-tender; bowel sounds normal; no masses,  no organomegaly   Vulva:  not evaluated  Vagina: not evaluated  Cervix:  not evaluated  Corpus: not examined  Adnexa:  not evaluated  Rectal Exam: Not performed.        Assessment:    NL postpartum exam.   Plan:   1. Contraception: condoms 2. Return to normal activities 3. Follow up  as needed. Instructed to see PCP if HA continue or worsens.

## 2018-05-30 NOTE — Patient Instructions (Signed)
[place] (Health Maintenance, Female) Un estilo de vida saludable y los cuidados preventivos pueden favorecer considerablemente a la salud y Musician. Pregunte a su mdico cul es el cronograma de exmenes peridicos apropiado para usted. Esta es una buena oportunidad para consultarlo sobre cmo prevenir enfermedades y [place] sano. Adems de los controles, hay muchas otras cosas que puede hacer usted mismo. Los expertos han realizado numerosas investigaciones [employer] cambios en el estilo de vida y las medidas de prevencin que, [place], lo ayudarn a mantenerse sano. Solicite a su mdico ms informacin. EL PESO Y LA DIETA Consuma una dieta saludable.  Asegrese de [employer] verduras, frutas, productos lcteos de bajo contenido de [place] y Advertising account planner.  No consuma muchos alimentos de alto contenido de grasas slidas, azcares agregados o sal.  Realice actividad fsica con regularidad. Esta es una de las prcticas ms importantes que puede hacer por su salud. ? La Delorise Shiner de los adultos deben hacer ejercicio durante al menos 124mnutos por semana. El ejercicio debe aumentar la frecuencia cardaca y pActorla transpiracin (ejercicio de [place]. ? La mayora de los adultos tambin deben hacer ejercicios de elongacin al mToysRusveces a la semana. Agregue esto al su plan de ejercicio de intensidad moderada. Mantenga un peso saludable.  El ndice de masa corporal ([facility] es una medida que puede utilizarse para identificar posibles problemas de pEast Uniontown Proporciona una estimacin de la grasa corporal basndose en el peso y la altura. Su mdico puede ayudarle a dRadiation protection practitionerISouth Endy a lScientist, forensico mTheatre managerun peso saludable.  Para las mujeres de 20aos o ms: ? Un IJohn R. Oishei Children'S Hospitalmenor de 18,5 se considera bajo peso. ? Un ICumberland County Hospitalentre 18,5 y 24,9 es normal. ? Un IPelham Medical Centerentre 25 y 29,9 se considera sobrepeso. ? Un IMC de 30 o ms se considera  obesidad. Observe los niveles de colesterol y lpidos en la sangre.  Debe comenzar a rEnglish as a second language teacherde lpidos y cResearch officer, trade unionen la sangre a los 20aos y luego repetirlos cada 516aos  Es posible que nAutomotive engineerlos niveles de colesterol con mayor frecuencia si: ? Sus niveles de lpidos y colesterol son altos. ? Es mayor de 527CWC ? Presenta un alto riesgo de padecer enfermedades cardacas. DETECCIN DE CNCER Cncer de pulmn  Se recomienda realizar exmenes de deteccin de cncer de pulmn a personas adultas entre 574y 892aos que estn en riesgo de dHorticulturist, commercialde pulmn por sus antecedentes de consumo de tabaco.  Se recomienda una tomografa computarizada de baja dosis de los pulmones todos los aos a las personas que: ? Fuman actualmente. ? Hayan dejado el hbito en algn momento en los ltimos 15aos. ? Hayan fumado durante 30aos un paquete diario. Un paquete-ao equivale a fumar un promedio de un paquete de cigarrillos diario durante un ao.  Los exmenes de deteccin anuales deben continuar hasta que hayan pasado 15aos desde que dej de fumar.  Ya no debern realizarse si tiene un problema de salud que le impida recibir tratamiento para eScience writerde pulmn. Cncer de mama  Practique la autoconciencia de la mama. Esto significa reconocer la apariencia normal de sus mamas y cmo las siente.  Tambin significa realizar autoexmenes regulares de lJohnson & Johnson Informe a su mdico sobre cualquier cambio, sin importar cun pequeo sea.  Si tiene entre 20 y 363aos, un mdico debe realizarle un examen clnico de las mamas como parte del examen regular de s[place] cada 1 a  3aos.  Si tiene 40aos o ms, debe realizarse un examen clnico de las mamas todos los aos. Tambin considere realizarse una radiografa de las mamas (mamografa) todos los aos.  Si tiene antecedentes familiares de cncer de mama, hable con su mdico para someterse a un estudio gentico.  Si  tiene alto riesgo de padecer cncer de mama, hable con su mdico para someterse a una resonancia magntica y una mamografa todos los aos.  La evaluacin del gen del cncer de mama (BRCA) se recomienda a mujeres que tengan familiares con cnceres relacionados con el BRCA. Los cnceres relacionados con el BRCA incluyen los siguientes: ? Mama. ? Ovario. ? Trompas. ? Cnceres de peritoneo.  Los resultados de la evaluacin determinarn la necesidad de asesoramiento gentico y de anlisis de BRCA1 y BRCA2. Cncer de cuello del tero El mdico puede recomendarle que se haga pruebas peridicas de deteccin de cncer de los rganos de la pelvis (ovarios, tero y vagina). Estas pruebas incluyen un examen plvico, que abarca controlar si se produjeron cambios microscpicos en la superficie del cuello del tero (prueba de Papanicolaou). Pueden recomendarle que se haga estas pruebas cada 3aos, a partir de los 21aos.  A las mujeres que tienen entre 30 y 65aos, los mdicos pueden recomendarles que se sometan a exmenes plvicos y pruebas de Papanicolaou cada 3aos, o a la prueba de Papanicolaou y el examen plvico en combinacin con estudios de deteccin del virus del papiloma humano (VPH) cada 5aos. Algunos tipos de VPH aumentan el riesgo de padecer cncer de cuello del tero. La prueba para la deteccin del VPH tambin puede realizarse a mujeres de cualquier edad cuyos resultados de la prueba de Papanicolaou no sean claros.  Es posible que otros mdicos no recomienden exmenes de deteccin a mujeres no embarazadas que se consideran sujetos de bajo riesgo de padecer cncer de pelvis y que no tienen sntomas. Pregntele al mdico si un examen plvico de deteccin es adecuado para usted.  Si ha recibido un tratamiento para el cncer cervical o una enfermedad que podra causar cncer, necesitar realizarse una prueba de Papanicolaou y controles durante al menos 20 aos de concluido el tratamiento. Si no se  ha hecho el Papanicolaou con regularidad, debern volver a evaluarse los factores de riesgo (como tener un nuevo compaero sexual), para determinar si debe realizarse los estudios nuevamente. Algunas mujeres sufren problemas mdicos que aumentan la probabilidad de contraer cncer de cuello del tero. En estos casos, el mdico podr indicar que se realicen controles y pruebas de Papanicolaou con ms frecuencia. Cncer colorrectal  Este tipo de cncer puede detectarse y a menudo prevenirse.  Por lo general, los estudios de rutina se deben comenzar a hacer a partir de los 50 aos y hasta los 75 aos.  Sin embargo, el mdico podr aconsejarle que lo haga antes, si tiene factores de riesgo para el cncer de colon.  Tambin puede recomendarle que use un kit de prueba para hallar sangre oculta en la materia fecal.  Es posible que se use una pequea cmara en el extremo de un tubo para examinar directamente el colon (sigmoidoscopia o colonoscopia) a fin de detectar formas tempranas de cncer colorrectal.  Los exmenes de rutina generalmente comienzan a los 50aos.  El examen directo del colon se debe repetir cada 5 a 10aos hasta los 75aos. Sin embargo, es posible que se realicen exmenes con mayor frecuencia, si se detectan formas tempranas de plipos precancerosos o pequeos bultos. Cncer de piel  Revise la piel   de la cabeza a los pies con regularidad.  Informe a su mdico si aparecen nuevos lunares o los que tiene se modifican, especialmente en su forma y color.  Tambin notifique al mdico si tiene un lunar que es ms grande que el tamao de una goma de lpiz.  Siempre use pantalla solar. Aplique pantalla solar de manera libre y repetida a lo largo del da.  Protjase usando mangas y pantalones largos, un sombrero de ala ancha y gafas para el sol, siempre que se encuentre en el exterior. ENFERMEDADES CARDACAS, DIABETES E HIPERTENSIN ARTERIAL  La hipertensin arterial causa  enfermedades cardacas y aumenta el riesgo de ictus. La hipertensin arterial es ms probable en los siguientes casos: ? Las personas que tienen la presin arterial en el extremo del rango normal (100-139/85-89 mm Hg). ? Las personas con sobrepeso u obesidad. ? Las personas afroamericanas.  Si usted tiene entre 18 y 39 aos, debe medirse la presin arterial cada 3 a 5 aos. Si usted tiene 40 aos o ms, debe medirse la presin arterial todos los aos. Debe medirse la presin arterial dos veces: una vez cuando est en un hospital o una clnica y la otra vez cuando est en otro sitio. Registre el promedio de las dos mediciones. Para controlar su presin arterial cuando no est en un hospital o una clnica, puede usar lo siguiente: ? Una mquina automtica para medir la presin arterial en una farmacia. ? Un monitor para medir la presin arterial en el hogar.  Si tiene entre 55 y 79 aos, consulte a su mdico si debe tomar aspirina para prevenir el ictus.  Realcese exmenes de deteccin de la diabetes con regularidad. Esto incluye la toma de una muestra de sangre para controlar el nivel de azcar en la sangre durante el ayuno. ? Si tiene un peso normal y un bajo riesgo de padecer diabetes, realcese este anlisis cada tres aos despus de los 45aos. ? Si tiene sobrepeso y un alto riesgo de padecer diabetes, considere someterse a este anlisis antes o con mayor frecuencia. PREVENCIN DE INFECCIONES HepatitisB  Si tiene un riesgo ms alto de contraer hepatitis B, debe someterse a un examen de deteccin de este virus. Se considera que tiene un alto riesgo de contraer hepatitis B si: ? Naci en un pas donde la hepatitis B es frecuente. Pregntele a su mdico qu pases son considerados de alto riesgo. ? Sus padres nacieron en un pas de alto riesgo y usted no recibi una vacuna que lo proteja contra la hepatitis B (vacuna contra la hepatitis B). ? Tiene VIH o sida. ? Usa agujas para inyectarse  drogas. ? Vive con alguien que tiene hepatitis B. ? Ha tenido sexo con alguien que tiene hepatitis B. ? Recibe tratamiento de hemodilisis. ? Toma ciertos medicamentos para el cncer, trasplante de rganos y afecciones autoinmunitarias. Hepatitis C  Se recomienda un anlisis de sangre para: ? Todos los que nacieron entre 1945 y 1965. ? Todas las personas que tengan un riesgo de haber contrado hepatitis C. Enfermedades de transmisin sexual (ETS).  Debe realizarse pruebas de deteccin de enfermedades de transmisin sexual (ETS), incluidas gonorrea y clamidia si: ? Es sexualmente activo y es menor de 24aos. ? Es mayor de 24aos, y el mdico le informa que corre riesgo de tener este tipo de infecciones. ? La actividad sexual ha cambiado desde que le hicieron la ltima prueba de deteccin y tiene un riesgo mayor de tener clamidia o gonorrea. Pregntele al mdico si usted   tiene riesgo.  Si no tiene el VIH, pero corre riesgo de infectarse por el virus, se recomienda tomar diariamente un medicamento recetado para evitar la infeccin. Esto se conoce como profilaxis previa a la exposicin. Se considera que est en riesgo si: ? Es [place] sexualmente y no [place] preservativos habitualmente o no conoce el estado del VIH de sus Advertising copywriter. ? Se inyecta drogas. ? Es [place] sexualmente con Ardelia Mems pareja que tiene VIH. Consulte a su mdico para saber si tiene un alto riesgo de infectarse por el VIH. Si opta por comenzar la profilaxis previa a la exposicin, primero debe realizarse anlisis de deteccin del VIH. Luego, le harn anlisis cada 34mses mientras est tomando los medicamentos para la profilaxis previa a la exposicin. [facility] Si es premenopusica y puede quedar e[place] solicite a su mdico asesoramiento previo a la concepcin.  Si puede quedar embarazada, tome 400 a [id number]([id number]) de cido fAnheuser-Busch  Si desea evitar el embarazo, hable con su mdico sobre el  control de la natalidad (anticoncepcin). OSTEOPOROSIS Y MENOPAUSIA  La osteoporosis es una enfermedad en la que los huesos pierden los minerales y la fuerza por el avance de la edad. El resultado pueden ser fracturas graves en los hSay[place] El riesgo de osteoporosis puede identificarse con uArdelia Memsprueba de densidad sea.  Si tiene 65aos o ms, o si est en riesgo de sufrir osteoporosis y fracturas, pregunte a su mdico si debe someterse a exmenes.  Consulte a su mdico si debe tomar un suplemento de calcio o de vitamina D para reducir el riesgo de osteoporosis.  La menopausia puede presentar ciertos sntomas fsicos y rGaffer  La terapia de reemplazo hormonal puede reducir algunos de estos sntomas y rGaffer Consulte a su mdico para saber si la terapia de reemplazo hormonal es conveniente para usted. INSTRUCCIONES PARA EL CUIDADO EN EL HOGAR  Realcese los estudios de rutina de la salud, dentales y de lPublic librarian  MBath  No consuma ningn producto que contenga tabaco, lo que incluye cigarrillos, tabaco de mHigher education careers advisero cPsychologist, sport and exercise  Si est embarazada, no beba alcohol.  Si est amamantando, reduzca el consumo de alcohol y la frecuencia con la que consume.  Si es mujer y no est embarazada limite el consumo de alcohol a no ms de 1 medida por da. Una medida equivale a 12onzas de cerveza, 5onzas de vino o 1onzas de bebidas alcohlicas de alta graduacin.  No consuma drogas.  No comparta agujas.  Solicite ayuda a su mdico si necesita apoyo o informacin para abandonar las drogas.  Informe a su mdico si a menudo se siente deprimido.  Notifique a su mdico si alguna vez ha sido vctima de abuso o si no se siente seguro en su hogar. Esta informacin no tiene cMarine scientistel consejo del mdico. Asegrese de hacerle al mdico cualquier pregunta que tenga. Document Released: 10/21/2011 Document Revised: 11/22/2014 Document Reviewed:  08/05/2015 Elsevier Interactive Patient Education  2[employer]
